# Patient Record
Sex: Female | Born: 1975 | Hispanic: Yes | Marital: Married | Smoking: Never smoker
Health system: Southern US, Community
[De-identification: ages and names within clinical notes are randomized; demographics above are authoritative.]

## PROBLEM LIST (undated history)

## (undated) ENCOUNTER — Inpatient Hospital Stay (HOSPITAL_COMMUNITY): Payer: Self-pay

## (undated) DIAGNOSIS — Z789 Other specified health status: Secondary | ICD-10-CM

## (undated) DIAGNOSIS — O09529 Supervision of elderly multigravida, unspecified trimester: Secondary | ICD-10-CM

## (undated) DIAGNOSIS — R87629 Unspecified abnormal cytological findings in specimens from vagina: Secondary | ICD-10-CM

## (undated) DIAGNOSIS — Z8759 Personal history of other complications of pregnancy, childbirth and the puerperium: Secondary | ICD-10-CM

## (undated) HISTORY — DX: Supervision of elderly multigravida, unspecified trimester: O09.529

## (undated) HISTORY — DX: Personal history of other complications of pregnancy, childbirth and the puerperium: Z87.59

## (undated) HISTORY — PX: NO PAST SURGERIES: SHX2092

## (undated) HISTORY — DX: Unspecified abnormal cytological findings in specimens from vagina: R87.629

---

## 2001-01-15 ENCOUNTER — Ambulatory Visit (HOSPITAL_COMMUNITY): Admission: RE | Admit: 2001-01-15 | Discharge: 2001-01-15 | Payer: Self-pay | Admitting: *Deleted

## 2001-01-16 ENCOUNTER — Inpatient Hospital Stay (HOSPITAL_COMMUNITY): Admission: AD | Admit: 2001-01-16 | Discharge: 2001-01-16 | Payer: Self-pay | Admitting: *Deleted

## 2001-01-22 ENCOUNTER — Encounter (HOSPITAL_COMMUNITY): Admission: RE | Admit: 2001-01-22 | Discharge: 2001-02-16 | Payer: Self-pay | Admitting: Obstetrics

## 2001-02-16 ENCOUNTER — Inpatient Hospital Stay (HOSPITAL_COMMUNITY): Admission: AD | Admit: 2001-02-16 | Discharge: 2001-02-18 | Payer: Self-pay | Admitting: *Deleted

## 2002-08-28 ENCOUNTER — Inpatient Hospital Stay (HOSPITAL_COMMUNITY): Admission: AD | Admit: 2002-08-28 | Discharge: 2002-08-28 | Payer: Self-pay | Admitting: Obstetrics and Gynecology

## 2002-08-30 ENCOUNTER — Inpatient Hospital Stay (HOSPITAL_COMMUNITY): Admission: AD | Admit: 2002-08-30 | Discharge: 2002-08-30 | Payer: Self-pay | Admitting: Obstetrics and Gynecology

## 2002-09-06 ENCOUNTER — Inpatient Hospital Stay (HOSPITAL_COMMUNITY): Admission: AD | Admit: 2002-09-06 | Discharge: 2002-09-06 | Payer: Self-pay | Admitting: Obstetrics and Gynecology

## 2002-09-16 ENCOUNTER — Inpatient Hospital Stay (HOSPITAL_COMMUNITY): Admission: AD | Admit: 2002-09-16 | Discharge: 2002-09-16 | Payer: Self-pay | Admitting: Obstetrics and Gynecology

## 2002-09-23 ENCOUNTER — Inpatient Hospital Stay (HOSPITAL_COMMUNITY): Admission: AD | Admit: 2002-09-23 | Discharge: 2002-09-23 | Payer: Self-pay | Admitting: Obstetrics and Gynecology

## 2002-11-27 ENCOUNTER — Encounter: Payer: Self-pay | Admitting: Obstetrics and Gynecology

## 2002-11-27 ENCOUNTER — Inpatient Hospital Stay (HOSPITAL_COMMUNITY): Admission: AD | Admit: 2002-11-27 | Discharge: 2002-11-27 | Payer: Self-pay | Admitting: Obstetrics and Gynecology

## 2005-02-07 ENCOUNTER — Ambulatory Visit: Payer: Self-pay | Admitting: Internal Medicine

## 2005-02-11 ENCOUNTER — Encounter: Admission: RE | Admit: 2005-02-11 | Discharge: 2005-02-11 | Payer: Self-pay | Admitting: Internal Medicine

## 2006-06-08 ENCOUNTER — Ambulatory Visit: Payer: Self-pay | Admitting: Internal Medicine

## 2006-06-08 ENCOUNTER — Encounter (INDEPENDENT_AMBULATORY_CARE_PROVIDER_SITE_OTHER): Payer: Self-pay | Admitting: Internal Medicine

## 2006-06-08 DIAGNOSIS — E049 Nontoxic goiter, unspecified: Secondary | ICD-10-CM | POA: Insufficient documentation

## 2006-06-08 HISTORY — DX: Nontoxic goiter, unspecified: E04.9

## 2007-03-18 ENCOUNTER — Inpatient Hospital Stay (HOSPITAL_COMMUNITY): Admission: AD | Admit: 2007-03-18 | Discharge: 2007-03-20 | Payer: Self-pay | Admitting: Obstetrics

## 2009-10-29 ENCOUNTER — Ambulatory Visit: Payer: Self-pay | Admitting: Internal Medicine

## 2009-10-29 DIAGNOSIS — N6019 Diffuse cystic mastopathy of unspecified breast: Secondary | ICD-10-CM | POA: Insufficient documentation

## 2009-11-20 ENCOUNTER — Ambulatory Visit: Payer: Self-pay | Admitting: Internal Medicine

## 2009-11-20 LAB — CONVERTED CEMR LAB
Bilirubin Urine: NEGATIVE
Blood in Urine, dipstick: NEGATIVE
Glucose, Urine, Semiquant: NEGATIVE
Ketones, urine, test strip: NEGATIVE
Nitrite: NEGATIVE
Rapid HIV Screen: NEGATIVE
Urobilinogen, UA: 0.2
WBC Urine, dipstick: NEGATIVE

## 2009-11-22 LAB — CONVERTED CEMR LAB
ALT: 11 units/L (ref 0–35)
Albumin: 4.4 g/dL (ref 3.5–5.2)
BUN: 13 mg/dL (ref 6–23)
Basophils Absolute: 0 10*3/uL (ref 0.0–0.1)
Basophils Relative: 0 % (ref 0–1)
CO2: 25 meq/L (ref 19–32)
Creatinine, Ser: 0.53 mg/dL (ref 0.40–1.20)
Glucose, Bld: 84 mg/dL (ref 70–99)
HCT: 42 % (ref 36.0–46.0)
Hemoglobin: 12.9 g/dL (ref 12.0–15.0)
Lymphocytes Relative: 27 % (ref 12–46)
Neutro Abs: 3.9 10*3/uL (ref 1.7–7.7)
Sodium: 139 meq/L (ref 135–145)
Total Bilirubin: 0.4 mg/dL (ref 0.3–1.2)
VLDL: 14 mg/dL (ref 0–40)

## 2009-11-24 ENCOUNTER — Ambulatory Visit: Payer: Self-pay | Admitting: Internal Medicine

## 2009-11-24 LAB — CONVERTED CEMR LAB
Glucose, Urine, Semiquant: NEGATIVE
Protein, U semiquant: NEGATIVE
WBC Urine, dipstick: NEGATIVE

## 2009-11-26 ENCOUNTER — Ambulatory Visit (HOSPITAL_COMMUNITY): Admission: RE | Admit: 2009-11-26 | Discharge: 2009-11-26 | Payer: Self-pay | Admitting: Internal Medicine

## 2009-11-27 ENCOUNTER — Encounter: Admission: RE | Admit: 2009-11-27 | Discharge: 2009-11-27 | Payer: Self-pay | Admitting: Internal Medicine

## 2009-11-30 ENCOUNTER — Encounter (INDEPENDENT_AMBULATORY_CARE_PROVIDER_SITE_OTHER): Payer: Self-pay | Admitting: Internal Medicine

## 2010-09-19 ENCOUNTER — Encounter: Payer: Self-pay | Admitting: Internal Medicine

## 2010-09-28 NOTE — Progress Notes (Signed)
Summary: Office Visit//DEPRESSION SCREENING  Office Visit//DEPRESSION SCREENING   Imported By: Arta Bruce 01/28/2010 14:40:12  _____________________________________________________________________  External Attachment:    Type:   Image     Comment:   External Document

## 2010-09-28 NOTE — Assessment & Plan Note (Signed)
Summary: cpp exam//gk   Vital Signs:  Patient profile:   35 year old female LMP:     10/20/2009 Weight:      106.44 pounds BMI:     21.95 Temp:     98.1 degrees F Pulse rate:   75 / minute Pulse rhythm:   regular Resp:     20 per minute BP sitting:   100 / 67  (left arm) Cuff size:   regular  Vitals Entered By: Chauncy Passy, SMA CC: Pt. is here for her 35 y/o CPP. Pt. is concerned of her left breast. She feels pain once in awhile.  Is Patient Diabetic? No Pain Assessment Patient in pain? no       Does patient need assistance? Functional Status Self care Ambulation Normal LMP (date): 10/20/2009     Enter LMP: 10/20/2009   CC:  Pt. is here for her 35 y/o CPP. Pt. is concerned of her left breast. She feels pain once in awhile. Marland Kitchen  History of Present Illness: 35 yo female here for CPP.  Concerns:  1.  Left breast discomfort:  Has had for many years--comes and goes.  Will have for a few days and then goes away.  Can have the discomfort even when not getting ready to start a period, but generally worse if surrounding a period.  Pt's periods are not generally regular.  Describes the pain as a burning sensation--can radiate around chest to scapular area.  Worsened pain with movement.  Takes Tylenol and pain resolves.  Had a breast ultrasound 4 years ago and pt. was told had fibrocystic breast disease.  2.  Discussed labs all okay.  Habits & Providers  Alcohol-Tobacco-Diet     Alcohol drinks/day: 0     Tobacco Status: never  Exercise-Depression-Behavior     Drug Use: never  Current Medications (verified): 1)  None  Allergies (verified): No Known Drug Allergies  Past History:  Past Surgical History: Last updated: 10/29/2009 None  Past Medical History: Reviewed history from 10/29/2009 and no changes required. THYROMEGALY (ICD-240.9) FIBROCYSTIC BREAST DISEASE (ICD-610.1)  Family History: Reviewed history from 10/29/2009 and no changes required. Mother,  9:  Healthy Father, 50:  Healthy 7 Siblings:  all healthy Son, 8:  healthy Son, 2:  healthy  Social History: Reviewed history from 10/29/2009 and no changes required. Originally from [place]. Has lived in [place]. for 9 years Married. Lives at home with husband and 2 sons Homemaker  Review of Systems General:  Energy:  good.. Eyes:  Denies blurring. ENT:  Denies decreased hearing. CV:  Denies chest pain or discomfort and palpitations. Resp:  Denies cough and shortness of breath. GI:  Denies abdominal pain, bloody stools, constipation, dark tarry stools, and diarrhea. GU:  Denies discharge, dysuria, and urinary frequency; LMP:  10/20/09. MS:  Denies joint pain, joint redness, and joint swelling. Derm:  Denies lesion(s) and rash; Bump in vaginal area--still there--but going away.. Neuro:  Denies numbness, tingling, and weakness. Psych:  Denies anxiety, depression, and suicidal thoughts/plans; PHQ-9 was 0.  Physical Exam  General:  Well-developed,well-nourished,in no acute distress; alert,appropriate and cooperative throughout examination Head:  Normocephalic and atraumatic without obvious abnormalities. No apparent alopecia or balding. Eyes:  No corneal or conjunctival inflammation noted. EOMI. Perrla. Funduscopic exam benign, without hemorrhages, exudates or papilledema. Vision grossly normal. Ears:  External ear exam shows no significant lesions or deformities.  Otoscopic examination reveals clear canals, tympanic membranes are intact bilaterally without bulging, retraction, inflammation or discharge. Hearing  is grossly normal bilaterally. Nose:  External nasal examination shows no deformity or inflammation. Nasal mucosa are pink and moist without lesions or exudates. Mouth:  Oral mucosa and oropharynx without lesions or exudates.  Teeth in good repair. Neck:  No deformities, masses, or tenderness noted. Thyromegaly noted again--no definite nodule Chest Wall:  No deformities, masses,  or tenderness noted. Breasts:  No mass, nodules, thickening, tenderness, bulging, retraction, inflamation, nipple discharge or skin changes noted of right breast.  Pt. with nodular thickening involving lateral superior left breast and extending across to medial quadrant.  Tender in this area.  No other focal mass, skin dimpling or axillary adenopathy  Lungs:  Normal respiratory effort, chest expands symmetrically. Lungs are clear to auscultation, no crackles or wheezes. Heart:  Normal rate and regular rhythm. S1 and S2 normal without gallop, murmur, click, rub or other extra sounds. Abdomen:  Bowel sounds positive,abdomen soft and non-tender without masses, organomegaly or hernias noted. Genitalia:  Pelvic Exam:        External: normal female genitalia without lesions or masses--has a healing papule inside her labia on the left--less than 1/2 cm with minimal erythema.  No discharge        Vagina: normal without lesions or masses        Cervix: normal without lesions or masses        Adnexa: normal bimanual exam without masses or fullness        Uterus: normal by palpation        Pap smear: performed Msk:  No deformity or scoliosis noted of thoracic or lumbar spine.   Pulses:  R and L carotid,radial,femoral,dorsalis pedis and posterior tibial pulses are full and equal bilaterally Extremities:  No clubbing, cyanosis, edema, or deformity noted with normal full range of motion of all joints.   Neurologic:  No cranial nerve deficits noted. Station and gait are normal. Plantar reflexes are down-going bilaterally. DTRs are symmetrical throughout. Sensory, motor and coordinative functions appear intact. Skin:  Intact without suspicious lesions or rashes Cervical Nodes:  No lymphadenopathy noted Axillary Nodes:  No palpable lymphadenopathy Inguinal Nodes:  No significant adenopathy Psych:  Cognition and judgment appear intact. Alert and cooperative with normal attention span and concentration. No apparent  delusions, illusions, hallucinations.  PHQ -9 was 0   Impression & Recommendations:  Problem # 1:  ROUTINE GYNECOLOGICAL EXAMINATION (ICD-V72.31) Start Yasmin Orders: UA Dipstick w/o Micro (manual) ([address]) KOH/ WET [place] [phone]) Pap Smear, Thin Prep ( Collection of) [phone]) T- GC Chlamydia ([address]) T-Pap Smear, Thin Prep ([address])  Problem # 2:  BREAST THICKENING, LEFT (ICD-611.72) Suspect this is fibrocystic breast disease. Orders: Mammogram (Diagnostic) (Mammo)  Problem # 3:  THYROMEGALY (ICD-240.9) TSH recently normal Orders: Ultrasound (Ultrasound)  Complete Medication List: 1)  Yasmin 28 3-0.03 Mg Tabs (Drospirenone-ethinyl estradiol) .Marland Kitchen.. 1 tab by mouth daily as directed.  Patient Instructions: 1)  Follow up with Dr. Delrae Alfred in 1-2 months to follow up on breast and thyroid concerns 2)  CPP in 1 year with Dr. Smitty Pluck for appt. Prescriptions: YASMIN 28 3-0.03 MG TABS (DROSPIRENONE-ETHINYL ESTRADIOL) 1 tab by mouth daily as directed.  #1 pack x 11   Entered and Authorized by:   Julieanne Manson MD   Signed by:   Julieanne Manson MD on 11/24/2009   Method used:   Printed then faxed to ...       [facility] (retail)       [address]  [place], [place]  [address]       Ph: [phone]       Fax: [phone]   RxID:   [id number]   Laboratory Results   Urine Tests    Routine Urinalysis   Color: yellow Appearance: Clear Glucose: negative   (Normal Range: Negative) Bilirubin: small   (Normal Range: Negative) Ketone: negative   (Normal Range: Negative) Spec. Gravity: >=1.030   (Normal Range: 1.003-1.035) Blood: negative   (Normal Range: Negative) pH: 5.5   (Normal Range: 5.0-8.0) Protein: negative   (Normal Range: Negative) Urobilinogen: 0.2   (Normal Range: 0-1) Nitrite: negative   (Normal Range: Negative) Leukocyte Esterace: negative   (Normal Range: Negative)        Preventive Care  Screening  Prior Values:    Last Tetanus Booster:  Td (11/06/2005)     SBE:  yes--no changes--see HPI Mammogram/ultrasound--states had 4 years ago in [place] either a fibroadenoma or fibrocystic breast disease.

## 2010-09-28 NOTE — Assessment & Plan Note (Signed)
Summary: office visit//gk   Vital Signs:  Patient profile:   35 year old female Height:      58.5 inches Weight:      105.4 pounds BMI:     21.73 Temp:     97.7 degrees F oral Pulse rate:   67 / minute Pulse rhythm:   regular Resp:     16 per minute BP sitting:   96 / 64  (left arm) Cuff size:   regular  Vitals Entered By: Geanie Cooley  (October 29, 2009 12:45 PM)  O2 Flow:   ` CC: Reestablish PCP, and wants to schedule complete physical. Is Patient Diabetic? No Pain Assessment Patient in pain? no       Does patient need assistance? Functional Status Self care Ambulation Normal   CC:  Reestablish PCP and and wants to schedule complete physical..  History of Present Illness: 35 yo female here to reestablish--last seen 05/2006 for CPP.  No health concerns today.  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 Has lived in [place]. for 9 years Married. Lives at home with husband and 2 sons HomemakerSmoking Status:  never Drug Use:  never  Physical Exam  Neck:  Mild thryoid enlargement--no nodules, however, and nontender. Lungs:  Normal respiratory effort, chest expands symmetrically. Lungs are clear to auscultation, no crackles or wheezes. Heart:  Normal rate and regular rhythm. S1 and S2 normal without gallop, murmur, click, rub or other extra sounds.   Impression & Recommendations:  Problem # 1:  THYROMEGALY (ICD-240.9) No change. Will Check TSH with fasting labs just prior to CPP  Patient Instructions: 1)  CPP  with Dr. Delrae Alfred next available--please schedule fasting labs 2 days prior to CPP:  FLP, CBC, CMET, TSH, UA, HIV, RPR   Tetanus/Td Immunization History:    Tetanus/Td # 1:  Td (11/06/2005)

## 2010-09-28 NOTE — Letter (Signed)
Summary: *HSN Results Follow up  [facility]  [address] [place], [place] [address]   Phone: [phone]  Fax: [phone]      11/30/2009   [facility] [address] [place], [place]  [address]   Dear  [name],                            ____S.Drinkard,FNP   ____D. Gore,FNP       ____B. McPherson,MD   ____V. Rankins,MD    __X__[name],MD    ____N. Daphine Deutscher, FNP  ____D. Reche Dixon, MD    ____K. Philipp Deputy, MD    ____Other     This letter is to inform you that your recent test(s):  ___X____Pap Smear    ____X___Lab Test     ____X___X-ray    ___X____ is within acceptable limits  _______ requires a medication change  _______ requires a follow-up lab visit  _______ requires a follow-up visit with your provider   Comments:  Pap smear, labs, mammogram and breast ultrasound were all okay--let me know if any changes in your left breast--feel this is fibrocystic breast disease.       _________________________________________________________ If you have any questions, please contact our office                     Sincerely,  Julieanne Manson MD [facility]

## 2011-01-11 NOTE — Op Note (Signed)
NAMEMarland Kitchen  Morgan Singleton, Morgan Singleton     ACCOUNT NO.:  [id number]   MEDICAL RECORD NO.:  [id number]          PATIENT TYPE:  INP   LOCATION:  9166                          FACILITY:  [facility]   PHYSICIAN:  Kathreen Cosier, M.D.DATE OF BIRTH:  March 24, 1976   DATE OF PROCEDURE:  03/18/2007  DATE OF DISCHARGE:                               OPERATIVE REPORT   DELIVERY NOTE   The patient is a 35 year old gravida 3, para 1-0-1-1, who is at term and  fully dilated and +2 station.  Had fetal decelerations down to 60 for  greater than three minutes.  Midline episiotomy was performed and a  vacuum applied for one contraction, no pop-off.  Delivery effected.  She  had a nuchal cord x1 which was cut prior to delivery of the shoulders.  Apgars were 6 and 8.  Placenta spontaneously, to L&D.  Episiotomy  repaired with 2-0 Vicryl.           ______________________________  Kathreen Cosier, M.D.     BAM/MEDQ  D:  03/18/2007  T:  03/19/2007  Job:  [id number]

## 2011-06-13 LAB — CBC
HCT: 35.9 — ABNORMAL LOW
Hemoglobin: 11.9 — ABNORMAL LOW
Hemoglobin: 13.7
Platelets: 161
RDW: 15.3 — ABNORMAL HIGH
RDW: 15.8 — ABNORMAL HIGH
WBC: 9.3

## 2011-06-13 LAB — RPR: RPR Ser Ql: NONREACTIVE

## 2012-04-26 ENCOUNTER — Ambulatory Visit: Payer: Self-pay | Admitting: Family Medicine

## 2012-04-26 ENCOUNTER — Ambulatory Visit: Admit: 2012-04-26 | Payer: Self-pay | Admitting: Obstetrics and Gynecology

## 2012-04-26 ENCOUNTER — Encounter (HOSPITAL_COMMUNITY): Payer: Self-pay

## 2012-04-26 ENCOUNTER — Inpatient Hospital Stay (HOSPITAL_COMMUNITY): Payer: Medicaid Other

## 2012-04-26 ENCOUNTER — Inpatient Hospital Stay (HOSPITAL_COMMUNITY): Payer: Medicaid Other | Admitting: Anesthesiology

## 2012-04-26 ENCOUNTER — Encounter (HOSPITAL_COMMUNITY): Payer: Self-pay | Admitting: Anesthesiology

## 2012-04-26 ENCOUNTER — Encounter (HOSPITAL_COMMUNITY)
Admission: AD | Disposition: A | Discharge status: Home / Self Care | Payer: Self-pay | Source: Ambulatory Visit | Attending: Obstetrics and Gynecology

## 2012-04-26 ENCOUNTER — Inpatient Hospital Stay (HOSPITAL_COMMUNITY)
Admission: AD | Admit: 2012-04-26 | Discharge: 2012-04-26 | Disposition: A | Discharge status: Home / Self Care | Payer: Medicaid Other | Source: Ambulatory Visit | Attending: Obstetrics and Gynecology | Admitting: Obstetrics and Gynecology

## 2012-04-26 VITALS — BP 104/60 | HR 84 | Temp 98.4°F | Resp 16 | Ht 59.5 in | Wt 107.0 lb

## 2012-04-26 DIAGNOSIS — R109 Unspecified abdominal pain: Secondary | ICD-10-CM | POA: Insufficient documentation

## 2012-04-26 DIAGNOSIS — N898 Other specified noninflammatory disorders of vagina: Secondary | ICD-10-CM

## 2012-04-26 DIAGNOSIS — Z789 Other specified health status: Secondary | ICD-10-CM

## 2012-04-26 DIAGNOSIS — R1084 Generalized abdominal pain: Secondary | ICD-10-CM

## 2012-04-26 DIAGNOSIS — O00109 Unspecified tubal pregnancy without intrauterine pregnancy: Secondary | ICD-10-CM

## 2012-04-26 DIAGNOSIS — Z609 Problem related to social environment, unspecified: Secondary | ICD-10-CM

## 2012-04-26 DIAGNOSIS — Z331 Pregnant state, incidental: Secondary | ICD-10-CM

## 2012-04-26 DIAGNOSIS — N939 Abnormal uterine and vaginal bleeding, unspecified: Secondary | ICD-10-CM

## 2012-04-26 DIAGNOSIS — Z3490 Encounter for supervision of normal pregnancy, unspecified, unspecified trimester: Secondary | ICD-10-CM

## 2012-04-26 HISTORY — DX: Other specified health status: Z78.9

## 2012-04-26 HISTORY — PX: LAPAROSCOPY: SHX197

## 2012-04-26 LAB — POCT URINALYSIS DIPSTICK
Bilirubin, UA: NEGATIVE
Blood, UA: NEGATIVE
Glucose, UA: NEGATIVE
Ketones, UA: NEGATIVE
Urobilinogen, UA: 0.2
pH, UA: 6

## 2012-04-26 LAB — POCT CBC
Lymph, poc: 1.5 (ref 0.6–3.4)
MCHC: 29.8 g/dL — AB (ref 31.8–35.4)
MCV: 84.3 fL (ref 80–97)
POC LYMPH PERCENT: 28.3 %L (ref 10–50)
Platelet Count, POC: 275 10*3/uL (ref 142–424)
RBC: 3.9 M/uL — AB (ref 4.04–5.48)
WBC: 5.4 10*3/uL (ref 4.6–10.2)

## 2012-04-26 LAB — WET PREP, GENITAL
Clue Cells Wet Prep HPF POC: NONE SEEN
Trich, Wet Prep: NONE SEEN

## 2012-04-26 LAB — ABO/RH: ABO/RH(D): O POS

## 2012-04-26 LAB — HCG, QUANTITATIVE, PREGNANCY: hCG, Beta Chain, Quant, S: 842 m[IU]/mL — ABNORMAL HIGH (ref <5)

## 2012-04-26 LAB — CBC
MCH: 26.6 pg (ref 26.0–34.0)
MCV: 81.3 fL (ref 78.0–100.0)
RBC: 3.53 MIL/uL — ABNORMAL LOW (ref 3.87–5.11)
WBC: 5.5 10*3/uL (ref 4.0–10.5)

## 2012-04-26 LAB — TYPE AND SCREEN

## 2012-04-26 SURGERY — LAPAROSCOPY OPERATIVE
Anesthesia: General | Site: Abdomen | Wound class: Clean Contaminated

## 2012-04-26 MED ORDER — FENTANYL CITRATE 0.05 MG/ML IJ SOLN
INTRAMUSCULAR | Status: DC | PRN
  Administered 2012-04-26 (×3): 100 ug via INTRAVENOUS

## 2012-04-26 MED ORDER — GLYCOPYRROLATE 0.2 MG/ML IJ SOLN
INTRAMUSCULAR | Status: DC | PRN
  Administered 2012-04-26: 0.4 mg via INTRAVENOUS

## 2012-04-26 MED ORDER — FENTANYL CITRATE 0.05 MG/ML IJ SOLN
INTRAMUSCULAR | Status: AC
  Filled 2012-04-26: qty 2

## 2012-04-26 MED ORDER — OXYCODONE-ACETAMINOPHEN 5-325 MG PO TABS: 1.0000 | ORAL_TABLET | ORAL | Status: AC | PRN

## 2012-04-26 MED ORDER — BUPIVACAINE HCL (PF) 0.25 % IJ SOLN
INTRAMUSCULAR | Status: DC | PRN
  Administered 2012-04-26 (×2): 5 mL

## 2012-04-26 MED ORDER — PROPOFOL 10 MG/ML IV EMUL
INTRAVENOUS | Status: DC | PRN
  Administered 2012-04-26: 100 mg via INTRAVENOUS

## 2012-04-26 MED ORDER — ROCURONIUM BROMIDE 100 MG/10ML IV SOLN
INTRAVENOUS | Status: DC | PRN
  Administered 2012-04-26: 5 mg via INTRAVENOUS
  Administered 2012-04-26: 20 mg via INTRAVENOUS

## 2012-04-26 MED ORDER — DEXAMETHASONE SODIUM PHOSPHATE 4 MG/ML IJ SOLN
INTRAMUSCULAR | Status: DC | PRN
  Administered 2012-04-26: 10 mg via INTRAVENOUS

## 2012-04-26 MED ORDER — EPHEDRINE SULFATE 50 MG/ML IJ SOLN
INTRAMUSCULAR | Status: DC | PRN
  Administered 2012-04-26 (×2): 10 mg via INTRAVENOUS

## 2012-04-26 MED ORDER — METOCLOPRAMIDE HCL 5 MG/ML IJ SOLN
10.0000 mg | Freq: Once | INTRAMUSCULAR | Status: AC
  Administered 2012-04-26: 10 mg via INTRAVENOUS

## 2012-04-26 MED ORDER — MIDAZOLAM HCL 5 MG/5ML IJ SOLN
INTRAMUSCULAR | Status: DC | PRN
  Administered 2012-04-26: 2 mg via INTRAVENOUS

## 2012-04-26 MED ORDER — SUCCINYLCHOLINE CHLORIDE 20 MG/ML IJ SOLN
INTRAMUSCULAR | Status: DC | PRN
  Administered 2012-04-26: 100 mg via INTRAVENOUS

## 2012-04-26 MED ORDER — FAMOTIDINE IN NACL 20-0.9 MG/50ML-% IV SOLN
INTRAVENOUS | Status: AC
  Administered 2012-04-26: 20 mg via INTRAVENOUS
  Filled 2012-04-26: qty 50

## 2012-04-26 MED ORDER — NEOSTIGMINE METHYLSULFATE 1 MG/ML IJ SOLN
INTRAMUSCULAR | Status: DC | PRN
  Administered 2012-04-26: 3 mg via INTRAVENOUS

## 2012-04-26 MED ORDER — LACTATED RINGERS IV SOLN
INTRAVENOUS | Status: DC
  Administered 2012-04-26: 1000 mL via INTRAVENOUS
  Administered 2012-04-26: 16:00:00 via INTRAVENOUS

## 2012-04-26 MED ORDER — FENTANYL CITRATE 0.05 MG/ML IJ SOLN
25.0000 ug | INTRAMUSCULAR | Status: DC | PRN
  Administered 2012-04-26 (×3): 25 ug via INTRAVENOUS

## 2012-04-26 MED ORDER — IBUPROFEN 600 MG PO TABS: 600.0000 mg | ORAL_TABLET | Freq: Four times a day (QID) | ORAL | Status: AC | PRN

## 2012-04-26 MED ORDER — LIDOCAINE HCL (CARDIAC) 20 MG/ML IV SOLN
INTRAVENOUS | Status: DC | PRN
  Administered 2012-04-26: 40 mg via INTRAVENOUS

## 2012-04-26 MED ORDER — METOCLOPRAMIDE HCL 5 MG/ML IJ SOLN
INTRAMUSCULAR | Status: AC
  Filled 2012-04-26: qty 2

## 2012-04-26 MED ORDER — KETOROLAC TROMETHAMINE 30 MG/ML IJ SOLN
15.0000 mg | Freq: Once | INTRAMUSCULAR | Status: AC | PRN
  Administered 2012-04-26: 30 mg via INTRAVENOUS

## 2012-04-26 MED ORDER — DOCUSATE SODIUM 100 MG PO CAPS: 100.0000 mg | ORAL_CAPSULE | Freq: Two times a day (BID) | ORAL | Status: AC | PRN

## 2012-04-26 MED ORDER — KETOROLAC TROMETHAMINE 30 MG/ML IJ SOLN
INTRAMUSCULAR | Status: AC
  Filled 2012-04-26: qty 1

## 2012-04-26 MED ORDER — FAMOTIDINE IN NACL 20-0.9 MG/50ML-% IV SOLN
20.0000 mg | Freq: Once | INTRAVENOUS | Status: AC
  Administered 2012-04-26: 20 mg via INTRAVENOUS

## 2012-04-26 MED ORDER — ONDANSETRON HCL 4 MG/2ML IJ SOLN
INTRAMUSCULAR | Status: DC | PRN
  Administered 2012-04-26: 4 mg via INTRAVENOUS

## 2012-04-26 SURGICAL SUPPLY — 24 items
CHLORAPREP W/TINT 26ML (MISCELLANEOUS) ×2 IMPLANT
DERMABOND ADVANCED (GAUZE/BANDAGES/DRESSINGS) ×1
DERMABOND ADVANCED .7 DNX12 (GAUZE/BANDAGES/DRESSINGS) ×1 IMPLANT
ELECT REM PT RETURN 9FT ADLT (ELECTROSURGICAL)
ELECTRODE REM PT RTRN 9FT ADLT (ELECTROSURGICAL) IMPLANT
GLOVE BIOGEL PI IND STRL 6.5 (GLOVE) ×2 IMPLANT
GLOVE BIOGEL PI INDICATOR 6.5 (GLOVE) ×2
GLOVE SURG SS PI 6.0 STRL IVOR (GLOVE) ×2 IMPLANT
GOWN PREVENTION PLUS LG XLONG (DISPOSABLE) ×4 IMPLANT
HEMOSTAT SURGICEL 2X14 (HEMOSTASIS) ×2 IMPLANT
NS IRRIG 1000ML POUR BTL (IV SOLUTION) ×2 IMPLANT
PACK LAPAROSCOPY BASIN (CUSTOM PROCEDURE TRAY) ×2 IMPLANT
POUCH SPECIMEN RETRIEVAL 10MM (ENDOMECHANICALS) ×4 IMPLANT
PROTECTOR NERVE ULNAR (MISCELLANEOUS) ×2 IMPLANT
SEALER TISSUE G2 CVD JAW 35 (ENDOMECHANICALS) ×1 IMPLANT
SEALER TISSUE G2 CVD JAW 45CM (ENDOMECHANICALS) ×1
SET IRRIG TUBING LAPAROSCOPIC (IRRIGATION / IRRIGATOR) ×2 IMPLANT
SUT MNCRL AB 4-0 PS2 18 (SUTURE) ×2 IMPLANT
SUT VICRYL 0 UR6 27IN ABS (SUTURE) ×4 IMPLANT
TOWEL OR 17X24 6PK STRL BLUE (TOWEL DISPOSABLE) ×4 IMPLANT
TROCAR BALLN 12MMX100 BLUNT (TROCAR) ×2 IMPLANT
TROCAR XCEL NON-BLD 11X100MML (ENDOMECHANICALS) IMPLANT
TROCAR XCEL NON-BLD 5MMX100MML (ENDOMECHANICALS) ×4 IMPLANT
WATER STERILE IRR 1000ML POUR (IV SOLUTION) IMPLANT

## 2012-04-26 NOTE — Anesthesia Procedure Notes (Signed)
Procedure Name: Intubation Performed by: Marrion Coy R Pre-anesthesia Checklist: Patient identified, Emergency Drugs available, Suction available, Patient being monitored and Timeout performed Patient Re-evaluated:Patient Re-evaluated prior to inductionOxygen Delivery Method: Circle system utilized Preoxygenation: Pre-oxygenation with 100% oxygen Intubation Type: IV induction and Cricoid Pressure applied Ventilation: Mask ventilation without difficulty Laryngoscope Size: Mac and 3 Grade View: Grade II Tube type: Oral Tube size: 7.0 mm Number of attempts: 1 Placement Confirmation: ETT inserted through vocal cords under direct vision,  positive ETCO2 and breath sounds checked- equal and bilateral Tube secured with: Tape Dental Injury: Teeth and Oropharynx as per pre-operative assessment

## 2012-04-26 NOTE — MAU Note (Signed)
Patient states she has had abdominal pain for 6-7 days, had spotting this am but after being examined at Urgent Care states the bleeding is heavier.

## 2012-04-26 NOTE — Anesthesia Preprocedure Evaluation (Signed)
Anesthesia Evaluation  Patient identified by MRN, date of birth, ID band Patient awake    Reviewed: Allergy & Precautions, H&P , NPO status , Patient's Chart, lab work & pertinent test results, reviewed documented beta blocker date and time   History of Anesthesia Complications Negative for: history of anesthetic complications  Airway Mallampati: II TM Distance: >3 FB Neck ROM: full    Dental  (+) Teeth Intact   Pulmonary neg pulmonary ROS,  breath sounds clear to auscultation  Pulmonary exam normal       Cardiovascular negative cardio ROS  Rhythm:regular Rate:Normal     Neuro/Psych negative neurological ROS  negative psych ROS   GI/Hepatic negative GI ROS, Neg liver ROS,   Endo/Other  negative endocrine ROS  Renal/GU negative Renal ROS     Musculoskeletal   Abdominal   Peds  Hematology  (+) anemia ,   Anesthesia Other Findings   Reproductive/Obstetrics (+) Pregnancy (ectopic pregnancy)                           Anesthesia Physical Anesthesia Plan  ASA: II and Emergent  Anesthesia Plan: General ETT and Modified Rapid Sequence   Post-op Pain Management:    Induction:   Airway Management Planned:   Additional Equipment:   Intra-op Plan:   Post-operative Plan:   Informed Consent: I have reviewed the patients History and Physical, chart, labs and discussed the procedure including the risks, benefits and alternatives for the proposed anesthesia with the patient or authorized representative who has indicated his/her understanding and acceptance.   Dental Advisory Given  Plan Discussed with: CRNA and Surgeon  Anesthesia Plan Comments:         Anesthesia Quick Evaluation

## 2012-04-26 NOTE — Anesthesia Postprocedure Evaluation (Signed)
Anesthesia Post Note  Patient: Morgan Singleton  Procedure(s) Performed: Procedure(s) (LRB): LAPAROSCOPY OPERATIVE (N/A)  Anesthesia type: General  Patient location: PACU  Post pain: Pain level controlled  Post assessment: Post-op Vital signs reviewed  Last Vitals:  Filed Vitals:   04/26/12 1845  BP: 102/59  Pulse: 67  Temp:   Resp: 28    Post vital signs: Reviewed  Level of consciousness: sedated  Complications: No apparent anesthesia complications

## 2012-04-26 NOTE — Transfer of Care (Signed)
Immediate Anesthesia Transfer of Care Note  Patient: Morgan Singleton  Procedure(s) Performed: Procedure(s) (LRB): LAPAROSCOPY OPERATIVE (N/A)  Patient Location: PACU  Anesthesia Type: General  Level of Consciousness: awake, alert , oriented and patient cooperative  Airway & Oxygen Therapy: Patient Spontanous Breathing and Patient connected to nasal cannula oxygen  Post-op Assessment: Report given to PACU RN, Post -op Vital signs reviewed and stable and Patient moving all extremities X 4  Post vital signs: Reviewed and stable  Complications: No apparent anesthesia complications

## 2012-04-26 NOTE — Patient Instructions (Signed)
[facility]

## 2012-04-26 NOTE — Op Note (Signed)
Morgan Singleton PROCEDURE DATE: 04/26/2012  PREOPERATIVE DIAGNOSIS: Ruptured ectopic pregnancy POSTOPERATIVE DIAGNOSIS: Ruptured left fallopian tube ectopic pregnancy PROCEDURE: Laparoscopic left salpingectomy and removal of ectopic pregnancy SURGEON:  Dr. Catalina Antigua ASSISTANT: none ANESTHESIOLOGIST: Dana Allan, MD Orlie Pollen, CRNA - CRNA Dana Allan, MD - Anesthesiologist  INDICATIONS: 36 y.o. [phone] at [redacted]w[redacted]d here for with ruptured ectopic pregnancy. On exam, she had stable vital signs, and an acute abdomen. Hgb  Lab Results  Component Value Date   HGB 9.4* 04/26/2012   , blood type O POS. Patient was counseled regarding need for laparoscopic salpingectomy. Risks of surgery including bleeding which may require transfusion or reoperation, infection, injury to bowel or other surrounding organs, need for additional procedures including laparotomy and other postoperative/anesthesia complications were explained to patient.  Written informed consent was obtained.  FINDINGS:  300cc amount of hemoperitoneum of blood and clots.  Dilated left fallopian tube containing ectopic gestation with large adherent clot at fimbriated end. Small normal appearing uterus, normal right fallopian tube, left ovary and right ovary.  ANESTHESIA: General INTRAVENOUS FLUIDS: .2000 ml ESTIMATED BLOOD LOSS:  200 ml URINE OUTPUT: 100 ml SPECIMENS: left fallopian tube containing ectopic gestation and blood clot COMPLICATIONS: None immediate  PROCEDURE IN DETAIL:  The patient was taken to the operating room where general anesthesia was administered and was found to be adequate.  She was placed in the dorsal lithotomy position, and was prepped and draped in a sterile manner.  A Foley catheter was inserted into her bladder and attached to Adalay Azucena drainage and a uterine manipulator was then advanced into the uterus .  After an adequate timeout was performed, attention was then turned to the patient's abdomen  where a 10-mm skin incision was made on the umbilical fold.  The fascia was identified, tented up and incised with Mayo scissors. The fascia was tagged with 0- Vicryl. The peritoneum was identified tented up and entered sharply with Metzenbaum scissors.  10-mm trocar and sleeve were then advanced without difficulty into the abdomen where intraabdominal placement was confirmed by the laparoscope. Pneumoperitoneum was achieved with insufflation of CO2 gas. A survey of the patient's pelvis and abdomen revealed the findings as above.  Two ports were placed under direct visualization; 5-mm on the right 2 cm above and medial to the superior iliac spine and another 5-mm on the right 5 cm above the previous one.  The 5-mm Nezhat suction irrigator was then used to suction the hemoperitoneum and irrigate the pelvis.  Attention was then turned to the left fallopian tube which was grasped and ligated from the underlying mesosalpinx and uterine attachment using the Enseal instrument.  Good hemostasis was noted.  The specimen was placed in an EndoCatch bag and removed from the abdomen intact.  The abdomen was desufflated, and all instruments were removed.  The fascial incisions of both 10-mm sites were reapproximated with 0 Vicryl figure-of-eight stiches; and all skin incisions were closed with a 3-0 Vicryl subcuticular stitch. The patient tolerated the procedures well.  All instruments, needles, and sponge counts were correct x 2. The patient was taken to the recovery room in stable condition.    The patient will be discharged to home as per PACU criteria.  Routine postoperative instructions given.  She was prescribed Percocet, Ibuprofen and Colace.  She will follow up in the clinic in 2 weeks for postoperative evaluation .

## 2012-04-26 NOTE — MAU Provider Note (Signed)
History     CSN: [id number]  Arrival date and time: 04/26/12 1255   First Provider Initiated Contact with Patient 04/26/12 1344      Chief Complaint  Patient presents with  . Abdominal Pain  . Vaginal Bleeding   HPI  Morgan Singleton is 36 y.o. [id number], [redacted]w[redacted]d weeks presenting with abdominal pain unaware that she was pregnant.  LMP 04/04/12, normal cycle for her.  Not using anything for contraception. 1 sexual partner.  Pain began 1 week ago with bleeding that began this am. States it was in the pelvis earlier this week but now lower midline. Rating a 5/10.  Went to Urgent Care.  Sent here for further evaluation. Pain is worsened by movement.  Denies nausea and vomiting.    No past medical history on file.  No past surgical history on file.  No family history on file.  History  Substance Use Topics  . Smoking status: Never Smoker   . Smokeless tobacco: Not on file  . Alcohol Use: Not on file    Allergies: No Known Allergies  No prescriptions prior to admission    Review of Systems  Constitutional: Negative.   HENT: Negative.   Eyes: Negative.   Cardiovascular: Negative.   Gastrointestinal: Positive for abdominal pain (lower mid abdomen ). Negative for nausea and vomiting.  Genitourinary:       + vaginal bleeding   Physical Exam   Blood pressure 103/71, pulse 67, temperature 97.9 F (36.6 C), temperature source Oral, resp. rate 16, height 4\' 11"  (1.499 m), weight 48.716 kg (107 lb 6.4 oz), last menstrual period 04/04/2012, SpO2 100.00%.  Physical Exam  Constitutional: She is oriented to person, place, and time. She appears well-developed and well-nourished. No distress.  HENT:  Head: Normocephalic.  Neck: Normal range of motion.  Cardiovascular: Normal rate.   Respiratory: Effort normal.  GI: Soft. She exhibits no distension and no mass. There is tenderness (diffuse tenderness without point tenderness). There is no rebound and no guarding.  Genitourinary:  Uterus is tender (mild on palpation). Uterus is not enlarged. Right adnexum displays no mass, no tenderness and no fullness. Left adnexum displays no mass, no tenderness and no fullness. There is bleeding (moderate bright red blood without clot) around the vagina.  Neurological: She is alert and oriented to person, place, and time.  Skin: Skin is warm and dry.  Psychiatric: She has a normal mood and affect. Her behavior is normal.   Results for orders placed during the hospital encounter of 04/26/12 (from the past 24 hour(s))  HCG, QUANTITATIVE, PREGNANCY     Status: Abnormal   Collection Time   04/26/12  1:00 PM      Component Value Range   hCG, Beta Chain, Quant, S 842 (*) <5 mIU/mL  ABO/RH     Status: Normal   Collection Time   04/26/12  1:10 PM      Component Value Range   ABO/RH(D) O POS    WET PREP, GENITAL     Status: Abnormal   Collection Time   04/26/12  1:55 PM      Component Value Range   Yeast Wet Prep HPF POC NONE SEEN  NONE SEEN   Trich, Wet Prep NONE SEEN  NONE SEEN   Clue Cells Wet Prep HPF POC NONE SEEN  NONE SEEN   WBC, Wet Prep HPF POC FEW (*) NONE SEEN   POC CBC at Urgent Care--WBC 5.4  HGB 9.8                                             HCT 32.9  MAU Course  Procedures  GC/CHL culture to lab. MDM 14:43  Radiology called reported ruptured ectopic on left.  Call to Dr. Jolayne Panther to report findings.  She is on her way down. IV and type and screen ordered.    Assessment and Plan  A:  Ruptured left ectopic pregnancy  P:  To OR  KEY,EVE M 04/26/2012, 1:44 PM

## 2012-04-26 NOTE — Progress Notes (Signed)
Subjective:    Patient ID: Morgan Singleton, female    DOB: 1976/03/21, 36 y.o.   MRN: [id number]  HPI Morgan Singleton is a 36 y.o. female  Lower abdominal and pelvic pain - past 6 days.  No fever, N/V,  No urinary sx's.  No diarrhea, no constipation.  Worst pain yesterday - less today.   Tx: tylenol.    G3 P2 - abortion in 2003, 2 children - 3 yo and 34 yo.    Review of Systems  Constitutional: Negative for fever and chills.  Genitourinary: Positive for vaginal bleeding (small amount of bleeding today, LMP 04/04/12 - normal. did miss July menses, but normal menses in June. ). Negative for urgency, hematuria, vaginal discharge, difficulty urinating and vaginal pain.       Objective:   Physical Exam  Constitutional: She is oriented to person, place, and time. She appears well-developed and well-nourished.  HENT:  Head: Normocephalic and atraumatic.  Eyes: EOM are normal. Pupils are equal, round, and reactive to light.  Cardiovascular: Normal rate, regular rhythm, normal heart sounds and intact distal pulses.   Pulmonary/Chest: Effort normal and breath sounds normal.  Abdominal: Soft. Normal appearance and bowel sounds are normal. There is no hepatosplenomegaly. There is tenderness in the right upper quadrant, suprapubic area and left lower quadrant. There is tenderness at McBurney's point. There is no rigidity, no rebound, no guarding, no CVA tenderness and negative Murphy's sign.  Genitourinary: Right adnexum displays tenderness. Left adnexum displays tenderness. There is bleeding around the vagina. No tenderness around the vagina. No foreign body around the vagina.       Bleeding with some dark/clooted blot at cervical os, but no tissue visualized.   Neurological: She is alert and oriented to person, place, and time.  Skin: Skin is warm and dry.  Psychiatric: She has a normal mood and affect. Her behavior is normal.   Results for orders placed in visit on 04/26/12    POCT CBC      Component Value Range   WBC 5.4  4.6 - 10.2 K/uL   Lymph, poc 1.5  0.6 - 3.4   POC LYMPH PERCENT 28.3  10 - 50 %L   MID (cbc) 0.5  0 - 0.9   POC MID % 8.4  0 - 12 %M   POC Granulocyte 3.4  2 - 6.9   Granulocyte percent 63.3  37 - 80 %G   RBC 3.90 (*) 4.04 - 5.48 M/uL   Hemoglobin 9.8 (*) 12.2 - 16.2 g/dL   HCT, POC 40.9 (*) 81.1 - 47.9 %   MCV 84.3  80 - 97 fL   MCH, POC 25.1 (*) 27 - 31.2 pg   MCHC 29.8 (*) 31.8 - 35.4 g/dL   RDW, POC 91.4     Platelet Count, POC 275  142 - 424 K/uL   MPV 9.5  0 - 99.8 fL  POCT URINALYSIS DIPSTICK      Component Value Range   Color, UA yellow     Clarity, UA clear     Glucose, UA neg     Bilirubin, UA neg     Ketones, UA neg     Spec Grav, UA 1.025     Blood, UA neg     pH, UA 6.0     Protein, UA neg     Urobilinogen, UA 0.2     Nitrite, UA neg     Leukocytes, UA Negative    POCT URINE PREGNANCY  Component Value Range   Preg Test, Ur Positive           Assessment & Plan:  Morgan Singleton is a 36 y.o. female 1. Abdominal pain, acute, generalized  POCT CBC, POCT UA - Microscopic Only, POCT urinalysis dipstick  2. Vaginal bleeding, abnormal  POCT CBC, POCT urine pregnancy   Lower abdominal pain in newly diagnosed pregnancy with spotting this am - ddx impending/incomplete Ab, but with abdom pain/pelvic pain may need US imaging.  Discussed with nurse midwife at [facility].  Will be evaluated there - sent by private vehicle. Hemodynamically stable.   Language barrier - spanish spoken - understanding expressed.

## 2012-04-26 NOTE — H&P (Signed)
See MAU note.  Patient was seen in radiology suite. Results of the ultrasound were reviewed and explained. Need for surgical treatment explained to the patient. Risks, benefits and alternatives were reviewed including but not limited to risk of bleeding, infection and damage to adjacent organs. Patient verbalized understanding and all questions were answered. Patient signed consent for laparoscopic salpingectomy.

## 2012-04-27 ENCOUNTER — Encounter (HOSPITAL_COMMUNITY): Payer: Self-pay | Admitting: Obstetrics and Gynecology

## 2012-04-27 LAB — GC/CHLAMYDIA PROBE AMP, GENITAL: GC Probe Amp, Genital: NEGATIVE

## 2012-05-02 NOTE — MAU Provider Note (Signed)
Agree with above note.  Morgan Singleton 05/02/2012 5:22 AM

## 2012-05-14 ENCOUNTER — Ambulatory Visit (INDEPENDENT_AMBULATORY_CARE_PROVIDER_SITE_OTHER): Payer: Self-pay | Admitting: Obstetrics and Gynecology

## 2012-05-14 VITALS — BP 96/69 | HR 118 | Temp 97.3°F | Wt 107.3 lb

## 2012-05-14 DIAGNOSIS — Z09 Encounter for follow-up examination after completed treatment for conditions other than malignant neoplasm: Secondary | ICD-10-CM

## 2012-05-14 MED ORDER — FERROUS SULFATE 325 (65 FE) MG PO TABS: 325.0000 mg | ORAL_TABLET | Freq: Two times a day (BID) | ORAL | Status: DC

## 2012-05-14 MED ORDER — NORGESTIMATE-ETH ESTRADIOL 0.25-35 MG-MCG PO TABS: 1.0000 | ORAL_TABLET | Freq: Every day | ORAL | Status: DC

## 2012-05-14 NOTE — Progress Notes (Signed)
Patient ID: Morgan Singleton, female   DOB: [birth year], 36 y.o.   MRN: [id number] 36 yo [id number] s/p left salpingectomy on 8/29 for treatment of ectopic pregnancy presenting today for post-op check. Patient reports doing well since the surgery. Pathology results reviewed and explained to the patient. This was not a planned pregnancy and patient desires OCP for birth control. Patient has taken OCP in the past without any complications.   Abd: soft/NT/ND  Incisions: healed well.Singleton erythema, induration or drainage  A/P 36 yo s/p laparoscopic left salpingectomy for a ruptures ectopic pregnancy. - Patient medically cleared to resume all activities of daily living - Sprintec prescribed - Patient is an established patient at the health department and is planning to follow-up with them for further care or issues. - patient is not planning on becoming pregnant in the near future but understands her increased risk of another ectopic pregnancy as well as the importance of seeking early prenatal care for subsequent pregnancies.

## 2014-01-18 ENCOUNTER — Inpatient Hospital Stay (HOSPITAL_COMMUNITY): Payer: Medicaid Other

## 2014-01-18 ENCOUNTER — Encounter (HOSPITAL_COMMUNITY): Payer: Self-pay

## 2014-01-18 ENCOUNTER — Inpatient Hospital Stay (HOSPITAL_COMMUNITY)
Admission: AD | Admit: 2014-01-18 | Discharge: 2014-01-18 | Disposition: A | Discharge status: Home / Self Care | Payer: Medicaid Other | Source: Ambulatory Visit | Attending: Obstetrics & Gynecology | Admitting: Obstetrics & Gynecology

## 2014-01-18 DIAGNOSIS — O209 Hemorrhage in early pregnancy, unspecified: Secondary | ICD-10-CM | POA: Insufficient documentation

## 2014-01-18 DIAGNOSIS — R109 Unspecified abdominal pain: Secondary | ICD-10-CM | POA: Insufficient documentation

## 2014-01-18 LAB — URINALYSIS, ROUTINE W REFLEX MICROSCOPIC
BILIRUBIN URINE: NEGATIVE
Glucose, UA: NEGATIVE mg/dL
HGB URINE DIPSTICK: NEGATIVE
KETONES UR: NEGATIVE mg/dL
Leukocytes, UA: NEGATIVE
NITRITE: NEGATIVE
Protein, ur: NEGATIVE mg/dL
Specific Gravity, Urine: 1.025 (ref 1.005–1.030)
UROBILINOGEN UA: 0.2 mg/dL (ref 0.0–1.0)
pH: 6 (ref 5.0–8.0)

## 2014-01-18 LAB — WET PREP, GENITAL
CLUE CELLS WET PREP: NONE SEEN
Trich, Wet Prep: NONE SEEN

## 2014-01-18 LAB — CBC
HEMATOCRIT: 38.6 % (ref 36.0–46.0)
Hemoglobin: 12.9 g/dL (ref 12.0–15.0)
MCH: 27.3 pg (ref 26.0–34.0)
MCHC: 33.4 g/dL (ref 30.0–36.0)
MCV: 81.6 fL (ref 78.0–100.0)
PLATELETS: 226 10*3/uL (ref 150–400)
RBC: 4.73 MIL/uL (ref 3.87–5.11)
RDW: 14.9 % (ref 11.5–15.5)
WBC: 8.1 10*3/uL (ref 4.0–10.5)

## 2014-01-18 LAB — POCT PREGNANCY, URINE: Preg Test, Ur: POSITIVE — AB

## 2014-01-18 LAB — HCG, QUANTITATIVE, PREGNANCY: hCG, Beta Chain, Quant, S: 92367 m[IU]/mL — ABNORMAL HIGH (ref <5)

## 2014-01-18 MED ORDER — PRENATAL VITAMINS 0.8 MG PO TABS: 1.0000 | ORAL_TABLET | Freq: Every day | ORAL | Status: DC

## 2014-01-18 NOTE — MAU Provider Note (Signed)
Attestation of Attending Supervision of Obstetric Fellow: Evaluation and management procedures were performed by the Obstetric Fellow under my supervision and collaboration.  I have reviewed the Obstetric Fellow's note and chart, and I agree with the management and plan.  Lyfe Reihl, MD, FACOG Attending Obstetrician & Gynecologist Faculty Practice, [facility]   

## 2014-01-18 NOTE — MAU Note (Signed)
Pt states via Janett Billow interpreter that pain in middle of lower abdomen began three days ago. Intermittent. Bleeding a small amount this am. Yesterday had brown discharge.

## 2014-01-18 NOTE — Discharge Instructions (Signed)
Hemorragia vaginal durante el embarazo (primer trimestre)  (Vaginal Bleeding During Pregnancy, First Trimester)  Durante los primeros meses del embarazo es relativamente frecuente que se presente una pequeña hemorragia (manchas). Esta situación generalmente mejora por sí misma. Estas hemorragias o manchas tienen diversas causas al inicio del embarazo. Algunas manchas pueden estar relacionadas al embarazo y otras no. En la mayoría de los casos, la hemorragia es normal y no es un problema. Sin embargo, la hemorragia también puede ser un signo de algo grave. Debe informar a su médico de inmediato si tiene alguna hemorragia vaginal.  Algunas causas posibles de hemorragia vaginal durante el primer trimestre incluyen:  · Infección o inflamación del cuello del útero.  · Crecimientos anormales (pólipos) en el cuello del útero.  · Aborto espontáneo o amenaza de aborto espontáneo.  · Tejido del embarazo se ha desarrollado fuera del útero y en una trompa de falopio (embarazo ectópico).  · Se han desarrollado pequeños quistes en el útero en lugar de tejido de embarazo (embarazo molar).  INSTRUCCIONES PARA EL CUIDADO EN EL HOGAR   Controle su afección para ver si hay cambios. Las siguientes indicaciones ayudarán a aliviar cualquier molestia que pueda sentir:  · Siga las indicaciones del médico para restringir su actividad. Si el médico le indica descanso en la cama, debe quedarse en la cama y levantarse solo para ir al baño. No obstante, el médico puede permitirle que continué con tareas livianas.  · Si es necesario, organícese para que alguien le ayude con las actividades y responsabilidades cotidianas mientras está en cama.  · Lleve un registro de la cantidad y la saturación de las toallas higiénicas que utiliza cada día. Anote este dato.  · No use tampones.No se haga duchas vaginales.  · No tenga relaciones sexuales u orgasmos hasta que el médico la autorice.  · Si elimina tejido por la vagina, guárdelo para mostrárselo al  médico.  · Tome solo medicamentos de venta libre o recetados, según las indicaciones del médico.  · No tome aspirina, ya que puede causar hemorragias.  · Cumpla con todas las visitas de control, según le indique su médico.  SOLICITE ATENCIÓN MÉDICA SI:  · Tiene un sangrado vaginal en cualquier momento del embarazo.  · Tiene calambres o dolores de parto.  SOLICITE ATENCIÓN MÉDICA DE INMEDIATO SI:   · Siente calambres intensos en la espalda o en el vientre (abdomen).  · Tiene fiebre que los medicamentos no pueden controlar.  · Elimina coágulos grandes o tejido por la vagina.  · La hemorragia aumenta.  · Si se siente mareada, débil o se desmaya.  · Tiene escalofríos.  · Tiene una pérdida importante o sale líquido a borbotones por la vagina.  · Se desmaya mientras defeca.  ASEGÚRESE DE QUE:  · Comprende estas instrucciones.  · Controlará su afección.  · Recibirá ayuda de inmediato si no mejora o si empeora.  Document Released: 05/25/2005 Document Revised: 06/05/2013  ExitCare® Patient Information ©2014 [employer].

## 2014-01-18 NOTE — MAU Note (Signed)
Pt states LMP-11/21/2013.

## 2014-01-18 NOTE — MAU Provider Note (Signed)
  History     CSN: [id number]  Arrival date and time: 01/18/14 1028   None     Chief Complaint  Patient presents with  . Vaginal Bleeding   HPI  38 yo [id number] @ [redacted]w[redacted]d by LMP who presents for scant vaginal bleeding and abdominal pian. Pt has a hx of a ruptured ectopic in 2013 requiring laparoscopic left salpingectomy.  States that this time has had 3-4 days of lower abdominal discomfort. Not severe but constant and only made better by laying still.  Also in the same amount of time she has noticed some pink and brown blood every time she wipes. Two days ago, she had bleeding on her underwear but since then only with wiping.    Denies any fevers, chills, nausea, vomiting, diarrhea, vaginal discharge, cp, sob.    OB History   Grav Para Term Preterm Abortions TAB SAB Ect Mult Living   5 2 2  2  0 1 1 0 2      Past Medical History  Diagnosis Date  . No pertinent past medical history     Past Surgical History  Procedure Laterality Date  . No past surgeries    . Laparoscopy  04/26/2012    Procedure: LAPAROSCOPY OPERATIVE;  Surgeon: Mora Bellman, MD;  Location: [facility];  Service: Gynecology;  Laterality: N/A;  Left Salpingectomy    History reviewed. No pertinent family history.  History  Substance Use Topics  . Smoking status: Never Smoker   . Smokeless tobacco: Not on file  . Alcohol Use: No    Allergies: No Known Allergies  No prescriptions prior to admission    Review of Systems  All other systems reviewed and are negative.  Physical Exam   Blood pressure 104/85, pulse 68, temperature 98.6 F (37 C), temperature source Oral, resp. rate 18, last menstrual period 11/21/2013.  Physical Exam  Constitutional: She appears well-developed and well-nourished.  Appears comfortable in bed   HENT:  Head: Normocephalic and atraumatic.  Eyes: Pupils are equal, round, and reactive to light.  Neck: Neck supple.  Cardiovascular: Normal rate, regular rhythm and normal heart  sounds.   Respiratory: Effort normal and breath sounds normal.  GI: Soft. Bowel sounds are normal. She exhibits no distension. There is no tenderness. There is no rebound and no guarding.  Genitourinary: Uterus is enlarged and tender (slight). Cervix exhibits no motion tenderness and no friability. Right adnexum displays no mass and no tenderness. Left adnexum displays no mass and no tenderness. There is bleeding (scant amount of old dried blood in the posterior fornix. no active bleeding) around the vagina.  Musculoskeletal: Normal range of motion.  Neurological: She is alert.  Skin: Skin is warm and dry. No rash noted.    MAU Course  Procedures  MDM Cbc O+ blood type hcg quant - [id number]   Assessment and Plan  38 yo [id number] @ [redacted]w[redacted]d by LMP who presents today for slight vaginal bleeding and abdominal pain.   Concerning for possible ectopic pregnancy. Rh + from prior visits CBC showing hgb of 12.9 [place] of [address] corresponding to GA.    US obtained to confirm IUP and showed: viable IUP with HR @[redacted]w[redacted]d .    PNV given Will f/u at the [facility] HD Bleeding precautions and return precautions discussed.    Saylor Sheckler L Achaia Garlock 01/18/2014, 11:34 AM

## 2014-01-20 LAB — GC/CHLAMYDIA PROBE AMP
CT Probe RNA: NEGATIVE
GC PROBE AMP APTIMA: NEGATIVE

## 2014-03-03 ENCOUNTER — Encounter: Payer: Self-pay | Admitting: Medical

## 2014-03-10 ENCOUNTER — Other Ambulatory Visit (HOSPITAL_COMMUNITY): Payer: Self-pay | Admitting: Physician Assistant

## 2014-03-10 DIAGNOSIS — Z3689 Encounter for other specified antenatal screening: Secondary | ICD-10-CM

## 2014-03-10 LAB — OB RESULTS CONSOLE RPR: RPR: NONREACTIVE

## 2014-03-10 LAB — OB RESULTS CONSOLE ABO/RH: RH Type: POSITIVE

## 2014-03-10 LAB — OB RESULTS CONSOLE HEPATITIS B SURFACE ANTIGEN: HEP B S AG: NEGATIVE

## 2014-03-10 LAB — OB RESULTS CONSOLE HIV ANTIBODY (ROUTINE TESTING): HIV: NONREACTIVE

## 2014-03-10 LAB — OB RESULTS CONSOLE ANTIBODY SCREEN: Antibody Screen: NEGATIVE

## 2014-03-10 LAB — OB RESULTS CONSOLE GC/CHLAMYDIA
Chlamydia: NEGATIVE
Gonorrhea: NEGATIVE

## 2014-03-10 LAB — OB RESULTS CONSOLE RUBELLA ANTIBODY, IGM: Rubella: IMMUNE

## 2014-04-15 ENCOUNTER — Ambulatory Visit (HOSPITAL_COMMUNITY)
Admission: RE | Admit: 2014-04-15 | Discharge: 2014-04-15 | Disposition: A | Discharge status: Home / Self Care | Payer: Medicaid Other | Source: Ambulatory Visit | Attending: Physician Assistant | Admitting: Physician Assistant

## 2014-04-15 DIAGNOSIS — Z3689 Encounter for other specified antenatal screening: Secondary | ICD-10-CM | POA: Diagnosis not present

## 2014-04-15 DIAGNOSIS — O09529 Supervision of elderly multigravida, unspecified trimester: Secondary | ICD-10-CM | POA: Diagnosis not present

## 2014-04-15 DIAGNOSIS — O09522 Supervision of elderly multigravida, second trimester: Secondary | ICD-10-CM

## 2014-04-16 ENCOUNTER — Other Ambulatory Visit (HOSPITAL_COMMUNITY): Payer: Self-pay | Admitting: Physician Assistant

## 2014-04-16 DIAGNOSIS — Z0489 Encounter for examination and observation for other specified reasons: Secondary | ICD-10-CM

## 2014-04-16 DIAGNOSIS — IMO0002 Reserved for concepts with insufficient information to code with codable children: Secondary | ICD-10-CM

## 2014-05-20 ENCOUNTER — Ambulatory Visit (HOSPITAL_COMMUNITY)
Admission: RE | Admit: 2014-05-20 | Discharge: 2014-05-20 | Disposition: A | Discharge status: Home / Self Care | Payer: Medicaid Other | Source: Ambulatory Visit | Attending: Physician Assistant | Admitting: Physician Assistant

## 2014-05-20 DIAGNOSIS — O358XX0 Maternal care for other (suspected) fetal abnormality and damage, not applicable or unspecified: Secondary | ICD-10-CM | POA: Insufficient documentation

## 2014-05-20 DIAGNOSIS — IMO0002 Reserved for concepts with insufficient information to code with codable children: Secondary | ICD-10-CM

## 2014-05-20 DIAGNOSIS — Z363 Encounter for antenatal screening for malformations: Secondary | ICD-10-CM | POA: Insufficient documentation

## 2014-05-20 DIAGNOSIS — Z1389 Encounter for screening for other disorder: Secondary | ICD-10-CM | POA: Insufficient documentation

## 2014-05-20 DIAGNOSIS — O09522 Supervision of elderly multigravida, second trimester: Secondary | ICD-10-CM

## 2014-05-20 DIAGNOSIS — Z0489 Encounter for examination and observation for other specified reasons: Secondary | ICD-10-CM

## 2014-05-20 DIAGNOSIS — O09529 Supervision of elderly multigravida, unspecified trimester: Secondary | ICD-10-CM | POA: Insufficient documentation

## 2014-06-30 ENCOUNTER — Encounter (HOSPITAL_COMMUNITY): Payer: Self-pay

## 2014-08-14 LAB — OB RESULTS CONSOLE GBS: GBS: NEGATIVE

## 2014-08-14 LAB — OB RESULTS CONSOLE GC/CHLAMYDIA
CHLAMYDIA, DNA PROBE: NEGATIVE
GC PROBE AMP, GENITAL: NEGATIVE

## 2014-08-29 NOTE — L&D Delivery Note (Signed)
Operative Delivery Note At 7:04 PM a viable female was delivered via Vaginal, Vacuum Neurosurgeon).  Presentation: vertex; Position: Transverse; Station: +2.  Verbal consent: obtained from patient.  Risks and benefits discussed in detail.  Risks include, but are not limited to the risks of anesthesia, bleeding, infection, damage to maternal tissues, fetal cephalhematoma.  There is also the risk of inability to effect vaginal delivery of the head, or shoulder dystocia that cannot be resolved by established maneuvers, leading to the need for emergency cesarean section.  APGAR: 8, 9; weight  .   Placenta status: Intact, Spontaneous.   Cord: 3 vessels with the following complications: none.  Cord avulsed while placenta sitting in vagina, placenta extracted without complication from vagina, not true manual extraction.  Anesthesia: Epidural  Instruments: Kiwi vacuum Episiotomy: None Lacerations: Labial Suture Repair: 3.0 vicryl rapide Est. Blood Loss (mL):  283mL  Mom to postpartum.  Baby to Couplet care / Skin to Skin.  Jurnei Latini ROCIO 09/16/2014, 7:53 PM

## 2014-09-08 ENCOUNTER — Other Ambulatory Visit (HOSPITAL_COMMUNITY): Payer: Self-pay | Admitting: Nurse Practitioner

## 2014-09-08 DIAGNOSIS — O480 Post-term pregnancy: Secondary | ICD-10-CM

## 2014-09-10 ENCOUNTER — Other Ambulatory Visit (HOSPITAL_COMMUNITY): Payer: Self-pay | Admitting: Nurse Practitioner

## 2014-09-11 ENCOUNTER — Telehealth (HOSPITAL_COMMUNITY): Payer: Self-pay | Admitting: *Deleted

## 2014-09-11 ENCOUNTER — Ambulatory Visit (HOSPITAL_COMMUNITY): Payer: Medicaid Other

## 2014-09-11 ENCOUNTER — Encounter (HOSPITAL_COMMUNITY): Payer: Self-pay | Admitting: *Deleted

## 2014-09-11 ENCOUNTER — Ambulatory Visit (HOSPITAL_COMMUNITY)
Admission: RE | Admit: 2014-09-11 | Discharge: 2014-09-11 | Disposition: A | Discharge status: Home / Self Care | Payer: Medicaid Other | Source: Ambulatory Visit | Attending: Nurse Practitioner | Admitting: Nurse Practitioner

## 2014-09-11 DIAGNOSIS — O480 Post-term pregnancy: Secondary | ICD-10-CM | POA: Insufficient documentation

## 2014-09-11 DIAGNOSIS — Z3A40 40 weeks gestation of pregnancy: Secondary | ICD-10-CM | POA: Insufficient documentation

## 2014-09-11 NOTE — Telephone Encounter (Signed)
Preadmission screen [id number] interpreter number

## 2014-09-16 ENCOUNTER — Inpatient Hospital Stay (HOSPITAL_COMMUNITY)
Admission: RE | Admit: 2014-09-16 | Discharge: 2014-09-18 | DRG: 775 | Disposition: A | Discharge status: Home / Self Care | Payer: Medicaid Other | Source: Ambulatory Visit | Attending: Obstetrics and Gynecology | Admitting: Obstetrics and Gynecology

## 2014-09-16 ENCOUNTER — Encounter (HOSPITAL_COMMUNITY): Payer: Self-pay

## 2014-09-16 ENCOUNTER — Inpatient Hospital Stay (HOSPITAL_COMMUNITY): Payer: Medicaid Other | Admitting: Anesthesiology

## 2014-09-16 DIAGNOSIS — O480 Post-term pregnancy: Secondary | ICD-10-CM | POA: Diagnosis present

## 2014-09-16 DIAGNOSIS — O09523 Supervision of elderly multigravida, third trimester: Secondary | ICD-10-CM

## 2014-09-16 DIAGNOSIS — O481 Prolonged pregnancy: Secondary | ICD-10-CM | POA: Diagnosis present

## 2014-09-16 DIAGNOSIS — Z3A41 41 weeks gestation of pregnancy: Secondary | ICD-10-CM | POA: Diagnosis present

## 2014-09-16 LAB — CBC
HCT: 40.3 % (ref 36.0–46.0)
Hemoglobin: 13.6 g/dL (ref 12.0–15.0)
MCH: 27.9 pg (ref 26.0–34.0)
MCHC: 33.7 g/dL (ref 30.0–36.0)
MCV: 82.6 fL (ref 78.0–100.0)
Platelets: 138 10*3/uL — ABNORMAL LOW (ref 150–400)
RBC: 4.88 MIL/uL (ref 3.87–5.11)
RDW: 15.3 % (ref 11.5–15.5)
WBC: 5.9 10*3/uL (ref 4.0–10.5)

## 2014-09-16 LAB — TYPE AND SCREEN
ABO/RH(D): O POS
ANTIBODY SCREEN: NEGATIVE

## 2014-09-16 MED ORDER — FENTANYL 2.5 MCG/ML BUPIVACAINE 1/10 % EPIDURAL INFUSION (WH - ANES)
14.0000 mL/h | INTRAMUSCULAR | Status: DC | PRN
  Administered 2014-09-16: 14 mL/h via EPIDURAL
  Filled 2014-09-16: qty 125

## 2014-09-16 MED ORDER — OXYCODONE-ACETAMINOPHEN 5-325 MG PO TABS: 2.0000 | ORAL_TABLET | ORAL | Status: DC | PRN

## 2014-09-16 MED ORDER — LACTATED RINGERS IV SOLN
INTRAVENOUS | Status: DC
  Administered 2014-09-16 (×3): via INTRAVENOUS

## 2014-09-16 MED ORDER — PRENATAL MULTIVITAMIN CH
1.0000 | ORAL_TABLET | Freq: Every day | ORAL | Status: DC
  Administered 2014-09-17: 1 via ORAL
  Filled 2014-09-16: qty 1

## 2014-09-16 MED ORDER — EPHEDRINE 5 MG/ML INJ
10.0000 mg | INTRAVENOUS | Status: DC | PRN
  Filled 2014-09-16: qty 2

## 2014-09-16 MED ORDER — IBUPROFEN 600 MG PO TABS
600.0000 mg | ORAL_TABLET | Freq: Four times a day (QID) | ORAL | Status: DC
  Administered 2014-09-16 – 2014-09-18 (×6): 600 mg via ORAL
  Filled 2014-09-16 (×6): qty 1

## 2014-09-16 MED ORDER — OXYTOCIN 40 UNITS IN LACTATED RINGERS INFUSION - SIMPLE MED: 62.5000 mL/h | INTRAVENOUS | Status: DC

## 2014-09-16 MED ORDER — PHENYLEPHRINE 40 MCG/ML (10ML) SYRINGE FOR IV PUSH (FOR BLOOD PRESSURE SUPPORT)
80.0000 ug | PREFILLED_SYRINGE | INTRAVENOUS | Status: DC | PRN
  Filled 2014-09-16: qty 20
  Filled 2014-09-16: qty 2

## 2014-09-16 MED ORDER — CITRIC ACID-SODIUM CITRATE 334-500 MG/5ML PO SOLN: 30.0000 mL | ORAL | Status: DC | PRN

## 2014-09-16 MED ORDER — PHENYLEPHRINE 40 MCG/ML (10ML) SYRINGE FOR IV PUSH (FOR BLOOD PRESSURE SUPPORT)
80.0000 ug | PREFILLED_SYRINGE | INTRAVENOUS | Status: DC | PRN
  Filled 2014-09-16: qty 2

## 2014-09-16 MED ORDER — OXYCODONE-ACETAMINOPHEN 5-325 MG PO TABS: 1.0000 | ORAL_TABLET | ORAL | Status: DC | PRN

## 2014-09-16 MED ORDER — TERBUTALINE SULFATE 1 MG/ML IJ SOLN
0.2500 mg | Freq: Once | INTRAMUSCULAR | Status: DC | PRN
  Filled 2014-09-16: qty 1

## 2014-09-16 MED ORDER — DIPHENHYDRAMINE HCL 50 MG/ML IJ SOLN: 12.5000 mg | INTRAMUSCULAR | Status: DC | PRN

## 2014-09-16 MED ORDER — LACTATED RINGERS IV SOLN
500.0000 mL | Freq: Once | INTRAVENOUS | Status: AC
  Administered 2014-09-16: 500 mL via INTRAVENOUS

## 2014-09-16 MED ORDER — OXYTOCIN BOLUS FROM INFUSION
500.0000 mL | INTRAVENOUS | Status: DC
  Administered 2014-09-16: 500 mL via INTRAVENOUS

## 2014-09-16 MED ORDER — WITCH HAZEL-GLYCERIN EX PADS: 1.0000 "application " | MEDICATED_PAD | CUTANEOUS | Status: DC | PRN

## 2014-09-16 MED ORDER — LIDOCAINE HCL (PF) 1 % IJ SOLN
INTRAMUSCULAR | Status: DC | PRN
  Administered 2014-09-16 (×2): 5 mL

## 2014-09-16 MED ORDER — ACETAMINOPHEN 325 MG PO TABS: 650.0000 mg | ORAL_TABLET | ORAL | Status: DC | PRN

## 2014-09-16 MED ORDER — TETANUS-DIPHTH-ACELL PERTUSSIS 5-2.5-18.5 LF-MCG/0.5 IM SUSP: 0.5000 mL | Freq: Once | INTRAMUSCULAR | Status: DC

## 2014-09-16 MED ORDER — LIDOCAINE HCL (PF) 1 % IJ SOLN
30.0000 mL | INTRAMUSCULAR | Status: DC | PRN
  Filled 2014-09-16: qty 30

## 2014-09-16 MED ORDER — SENNOSIDES-DOCUSATE SODIUM 8.6-50 MG PO TABS
2.0000 | ORAL_TABLET | ORAL | Status: DC
  Administered 2014-09-16 – 2014-09-17 (×2): 2 via ORAL
  Filled 2014-09-16 (×2): qty 2

## 2014-09-16 MED ORDER — LANOLIN HYDROUS EX OINT: TOPICAL_OINTMENT | CUTANEOUS | Status: DC | PRN

## 2014-09-16 MED ORDER — DIPHENHYDRAMINE HCL 25 MG PO CAPS: 25.0000 mg | ORAL_CAPSULE | Freq: Four times a day (QID) | ORAL | Status: DC | PRN

## 2014-09-16 MED ORDER — OXYTOCIN 40 UNITS IN LACTATED RINGERS INFUSION - SIMPLE MED
1.0000 m[IU]/min | INTRAVENOUS | Status: DC
  Administered 2014-09-16: 2 m[IU]/min via INTRAVENOUS
  Filled 2014-09-16: qty 1000

## 2014-09-16 MED ORDER — LACTATED RINGERS IV SOLN
500.0000 mL | INTRAVENOUS | Status: DC | PRN
  Administered 2014-09-16: 500 mL via INTRAVENOUS

## 2014-09-16 MED ORDER — BENZOCAINE-MENTHOL 20-0.5 % EX AERO
1.0000 "application " | INHALATION_SPRAY | CUTANEOUS | Status: DC | PRN
  Administered 2014-09-17: 1 via TOPICAL
  Filled 2014-09-16: qty 56

## 2014-09-16 MED ORDER — SIMETHICONE 80 MG PO CHEW: 80.0000 mg | CHEWABLE_TABLET | ORAL | Status: DC | PRN

## 2014-09-16 MED ORDER — ONDANSETRON HCL 4 MG PO TABS: 4.0000 mg | ORAL_TABLET | ORAL | Status: DC | PRN

## 2014-09-16 MED ORDER — ONDANSETRON HCL 4 MG/2ML IJ SOLN: 4.0000 mg | INTRAMUSCULAR | Status: DC | PRN

## 2014-09-16 MED ORDER — DIBUCAINE 1 % RE OINT: 1.0000 "application " | TOPICAL_OINTMENT | RECTAL | Status: DC | PRN

## 2014-09-16 MED ORDER — ONDANSETRON HCL 4 MG/2ML IJ SOLN: 4.0000 mg | Freq: Four times a day (QID) | INTRAMUSCULAR | Status: DC | PRN

## 2014-09-16 NOTE — Anesthesia Procedure Notes (Signed)
Epidural Patient location during procedure: OB Start time: 09/16/2014 3:07 PM  Staffing Anesthesiologist: Rudean Curt Performed by: anesthesiologist   Preanesthetic Checklist Completed: patient identified, site marked, surgical consent, pre-op evaluation, timeout performed, IV checked, risks and benefits discussed and monitors and equipment checked  Epidural Patient position: sitting Prep: site prepped and draped and DuraPrep Patient monitoring: continuous pulse ox and blood pressure Approach: midline Location: L3-L4 Injection technique: LOR air  Needle:  Needle type: Tuohy  Needle gauge: 17 G Needle length: 9 cm and 9 Needle insertion depth: 5 cm cm Catheter type: closed end flexible Catheter size: 19 Gauge Catheter at skin depth: 10 cm Test dose: negative  Assessment Events: blood not aspirated, injection not painful, no injection resistance, negative IV test and no paresthesia  Additional Notes Patient identified.  Risk benefits discussed including failed block, incomplete pain control, headache, nerve damage, paralysis, blood pressure changes, nausea, vomiting, reactions to medication both toxic or allergic, and postpartum back pain.  Patient expressed understanding and wished to proceed.  All questions were answered.  Sterile technique used throughout procedure and epidural site dressed with sterile barrier dressing. No paresthesia or other complications noted.The patient did not experience any signs of intravascular injection such as tinnitus or metallic taste in mouth nor signs of intrathecal spread such as rapid motor block. Please see nursing notes for vital signs.

## 2014-09-16 NOTE — Progress Notes (Signed)
I assisted Chelsea, RN with questions.  [place] Interpreter.

## 2014-09-16 NOTE — Progress Notes (Signed)
Interpreter, [employer], at bedside.

## 2014-09-16 NOTE — Anesthesia Preprocedure Evaluation (Signed)
Anesthesia Evaluation  Patient identified by MRN, date of birth, ID band Patient awake    Reviewed: Allergy & Precautions, H&P , Patient's Chart, lab work & pertinent test results  Airway Mallampati: II  TM Distance: >3 FB Neck ROM: full    Dental   Pulmonary  breath sounds clear to auscultation        Cardiovascular Rhythm:regular Rate:Normal     Neuro/Psych    GI/Hepatic   Endo/Other    Renal/GU      Musculoskeletal   Abdominal   Peds  Hematology   Anesthesia Other Findings   Reproductive/Obstetrics (+) Pregnancy                             Anesthesia Physical Anesthesia Plan  ASA: II  Anesthesia Plan: Epidural   Post-op Pain Management:    Induction:   Airway Management Planned:   Additional Equipment:   Intra-op Plan:   Post-operative Plan:   Informed Consent: I have reviewed the patients History and Physical, chart, labs and discussed the procedure including the risks, benefits and alternatives for the proposed anesthesia with the patient or authorized representative who has indicated his/her understanding and acceptance.     Plan Discussed with:   Anesthesia Plan Comments:         Anesthesia Quick Evaluation

## 2014-09-16 NOTE — Progress Notes (Signed)
Interpreter, Marnette Burgess, at bedside.

## 2014-09-16 NOTE — H&P (Signed)
Morgan Singleton is a 39 y.o. female [id number] at [redacted]w[redacted]d presenting for induction of labor for prolonged pregnancy. Pt desires to breastfeed. She wants epidural. Plan for contraception is condoms and birth control pill. . Maternal Medical History:  Reason for admission: Nausea.  Fetal activity: Perceived fetal activity is normal.   Last perceived fetal movement was within the past hour.    Prenatal complications: No bleeding, pre-eclampsia or preterm labor.   Prenatal Complications - Diabetes: none.    OB History    Gravida Para Term Preterm AB TAB SAB Ectopic Multiple Living   5 2 2  2  0 1 1 0 2     Past Medical History  Diagnosis Date  . No pertinent past medical history   . AMA (advanced maternal age) multigravida 35+    Past Surgical History  Procedure Laterality Date  . No past surgeries    . Laparoscopy  04/26/2012    Procedure: LAPAROSCOPY OPERATIVE;  Surgeon: Morgan Bellman, MD;  Location: [facility];  Service: Gynecology;  Laterality: N/A;  Left Salpingectomy   Family History: family history is not on file. Social History:  reports that she has never smoked. She does not have any smokeless tobacco history on file. She reports that she does not drink alcohol or use illicit drugs.   Prenatal Transfer Tool  OB MD Prenatal Transfer Tool    09/16/14 1710   Maternal Diabetes  Maternal Diabetes  No         Genetic Screening  Genetic Screens  Normal         Maternal Ultrasounds/Referrals  Maternal Ultrasounds  Normal         Fetal US or Other Referrals  Fetal US or Other Referrals  None         Substance Abuse  Maternal substance abuse  No         Significant Maternal Labs  Maternal Significant Labs  No            Review of Systems  Constitutional: Negative for fever and chills.  Cardiovascular: Negative for chest pain.  Gastrointestinal: Negative for nausea, vomiting and abdominal pain.  Neurological: Negative for dizziness.    Dilation: 4.5 Effacement (%):  70 Station: -1 Exam by:: Dr. Deniece Singleton Blood pressure 128/74, pulse 63, temperature 97.8 F (36.6 C), temperature source Oral, resp. rate 18, height 4\' 11"  (1.499 m), weight 62.143 kg (137 lb), last menstrual period 11/21/2013, SpO2 100 %. Maternal Exam:  Abdomen: Patient reports no abdominal tenderness. Fetal presentation: vertex  Introitus: Normal vulva. Normal vagina.  Cervix: Cervix evaluated by digital exam.     Fetal Exam Fetal Monitor Review: Variability: moderate (6-25 bpm).       Physical Exam  Constitutional: She appears well-developed and well-nourished.  Cardiovascular: Normal rate and regular rhythm.   Respiratory: Effort normal and breath sounds normal.    Prenatal labs: ABO, Rh: --/--/O POS (01/19 9450) Antibody: NEG (01/19 3888) Rubella: Immune (07/13 0000) RPR: Nonreactive (07/13 0000)  HBsAg: Negative (07/13 0000)  HIV: Non-reactive (07/13 0000)  GBS: Negative (12/17 0000)   Assessment/Plan: 39 yo female [id number] at [redacted]w[redacted]d here for IOL for prolonged pregnancy.  # IOL for prolonged pregnancy - pit started - expectant management - cervical check 4/50/-1  # pain management- epidural  # contraception- condoms, OCP  Morgan Singleton 09/16/2014, 5:07 PM  OB fellow attestation:  I have seen and examined this patient; I agree with above documentation in the resident's note.   Morgan Singleton  is a 39 y.o. Morgan Singleton here for IOL 2/2 prolonged pregnancy  PE: BP 125/87 mmHg  Pulse 81  Temp(Src) 97.9 F (36.6 C) (Oral)  Resp 20  Ht 4\' 11"  (1.499 m)  Wt 137 lb (62.143 kg)  BMI 27.66 kg/m2  SpO2 100%  LMP 11/21/2013  Breastfeeding? Unknown Gen: calm comfortable, NAD Resp: normal effort, no distress Abd: gravid  ROS, labs, PMH reviewed  Plan: MOF: breast MOC: OCPs and condoms ID: GBS neg FWB: cat I Labor: start pitocin Circ: n/a Pain: epidural upon request  Morgan Singleton Morgan Singleton 09/16/2014, 8:16 PM

## 2014-09-16 NOTE — Progress Notes (Signed)
I assisted Chelsea, RN and FP with explanation of care plan.  [place]  Interpreter.

## 2014-09-17 LAB — RPR: RPR Ser Ql: NONREACTIVE

## 2014-09-17 LAB — HIV ANTIBODY (ROUTINE TESTING W REFLEX)
HIV 1/HIV 2 AB: NONREACTIVE
HIV 1/O/2 Abs-Index Value: <1 (ref <1.00)

## 2014-09-17 NOTE — Progress Notes (Signed)
I stopped by patients room to check on her needs. By Juliann Mule Spanish Interpreter.

## 2014-09-17 NOTE — Anesthesia Postprocedure Evaluation (Signed)
  Anesthesia Post-op Note  Patient: Morgan Singleton  Procedure(s) Performed: * No procedures listed *  Patient Location: Mother/Baby  Anesthesia Type:Epidural  Level of Consciousness: awake  Airway and Oxygen Therapy: Patient Spontanous Breathing  Post-op Pain: mild  Post-op Assessment: Patient's Cardiovascular Status Stable and Respiratory Function Stable  Post-op Vital Signs: stable  Last Vitals:  Filed Vitals:   09/17/14 0640  BP: 109/74  Pulse: 67  Temp: 36.7 C  Resp: 18    Complications: No apparent anesthesia complications

## 2014-09-17 NOTE — Lactation Note (Signed)
This note was copied from the chart of Morgan Singleton. Lactation Consultation Note  Patient Name: Morgan Singleton Today's Date: 09/17/2014 Reason for consult: Initial assessment [place] interpreter [phone] used for visit. Mom reports baby is BF well, denies questions or concerns. She is supplementing small amounts of formula till her milk comes in. [facility] reviewed risk of early supplementation unless medically necessary. Mom reports she is BF with each feeding before giving any supplements. Supplemental guidelines reviewed with Mom. Mom is experienced BF, some basic teaching reviewed. Lactation brochure left for review, advised of OP services and support group. Encouraged to call for questions/concerns or assist with BF.   Maternal Data Has patient been taught Hand Expression?: No (Mom reports she knows how to hand express) Does the patient have breastfeeding experience prior to this delivery?: Yes  Feeding Feeding Type: Bottle Fed - Formula Nipple Type: Slow - flow Length of feed: 15 min  LATCH Score/Interventions                Intervention(s): Breastfeeding basics reviewed     Lactation Tools Discussed/Used WIC Program: Yes   Consult Status Consult Status: Follow-up Date: 09/18/14 Follow-up type: In-patient    Katrine Coho 09/17/2014, 2:23 PM

## 2014-09-17 NOTE — Progress Notes (Signed)
I stopped by to check on patient's needs and ordered her lunch.  [place] Interpreter.

## 2014-09-17 NOTE — Progress Notes (Signed)
I assisted Claiborne Billings, RN with questions. [place]  Interpreter.

## 2014-09-17 NOTE — Progress Notes (Addendum)
Post Partum Day 1 Subjective: no complaints, up ad lib, voiding, tolerating PO, + flatus and bleeding wnl.  no BM yet  Objective: Blood pressure 109/74, pulse 67, temperature 98 F (36.7 C), temperature source Oral, resp. rate 18, height 4\' 11"  (1.499 m), weight 62.143 kg (137 lb), last menstrual period 11/21/2013, SpO2 99 %, unknown if currently breastfeeding.  Physical Exam:  General: alert and cooperative Lochia: appropriate Uterine Fundus: firm Heart and Lungs: RRR and CTA Incision: none DVT Evaluation: No evidence of DVT seen on physical exam.   Recent Labs  09/16/14 0813  HGB 13.6  HCT 40.3    Assessment/Plan: Plan for discharge tomorrow, Breastfeeding and Contraception pills or condoms   LOS: 1 day   Dannielle Huh 09/17/2014, 7:32 AM

## 2014-09-18 NOTE — Progress Notes (Signed)
Ur chart review completed.  

## 2014-09-18 NOTE — Discharge Summary (Signed)
  39 yo female [name] with no sig PMH or complications of pregnancy delivered healthy baby girl. Breastfeeding. Contraception condoms and OCPs.  Obstetric Discharge Summary Reason for Admission: onset of labor Prenatal Procedures: none Intrapartum Procedures: spontaneous vaginal delivery Postpartum Procedures: none Complications-Operative and Postpartum: none HEMOGLOBIN  Date Value Ref Range Status  09/16/2014 13.6 12.0 - 15.0 g/dL Final  04/26/2012 9.8* 12.2 - 16.2 g/dL Final   HCT  Date Value Ref Range Status  09/16/2014 40.3 36.0 - 46.0 % Final   HCT, POC  Date Value Ref Range Status  04/26/2012 32.9* 37.7 - 47.9 % Final    Physical Exam:  General: alert, cooperative and no distress Lochia: appropriate Uterine Fundus: firm Incision: none DVT Evaluation: No evidence of DVT seen on physical exam.  Discharge Diagnoses: Term Pregnancy-delivered  Discharge Information: Date: 09/18/2014 Activity: unrestricted Diet: routine Medications: None Condition: stable Instructions: see attachment Discharge to: home Follow-up Information    Follow up with [facility] [place]. Schedule an appointment as soon as possible for a visit in 6 weeks.   Why:  For your postpartum appointment.   Contact information:   [place] [address] [phone]      Newborn Data: Live born female  Birth Weight: 7 lb 0.4 oz (3185 g) APGAR: 8, 9  Home with mother.  Morgan Singleton 09/18/2014, 7:23 AM   I have seen and examined this patient and I agree with the above. Morgan Singleton CNM 9:28 AM 09/18/2014

## 2014-09-18 NOTE — Progress Notes (Signed)
During my shift I stopped  twice by patients room to check on her needs, by Juliann Mule Interpreter

## 2016-12-25 IMAGING — US US FETAL BPP W/O NONSTRESS
1 series · 13 of 28 positions shown · non-contrast
Comparison: none

[Series 1: us fetal bpp w/o nonstress · non-contrast · 28 acquisitions, 13 frames shown]
[im 2/28]
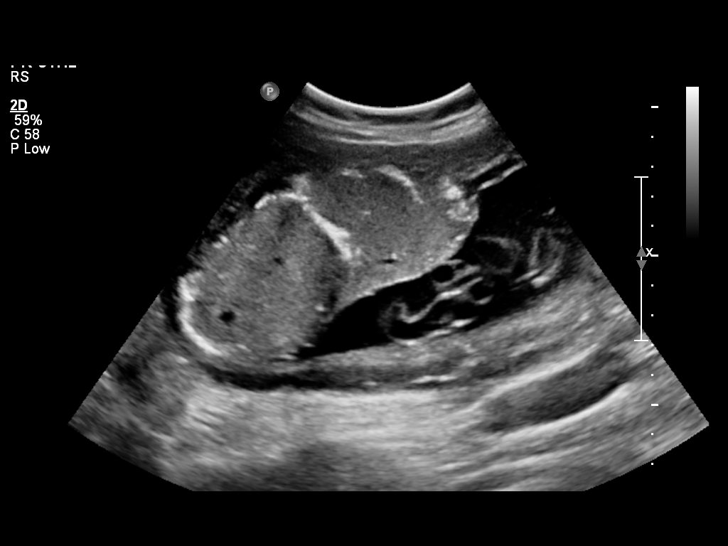
[im 4/28]
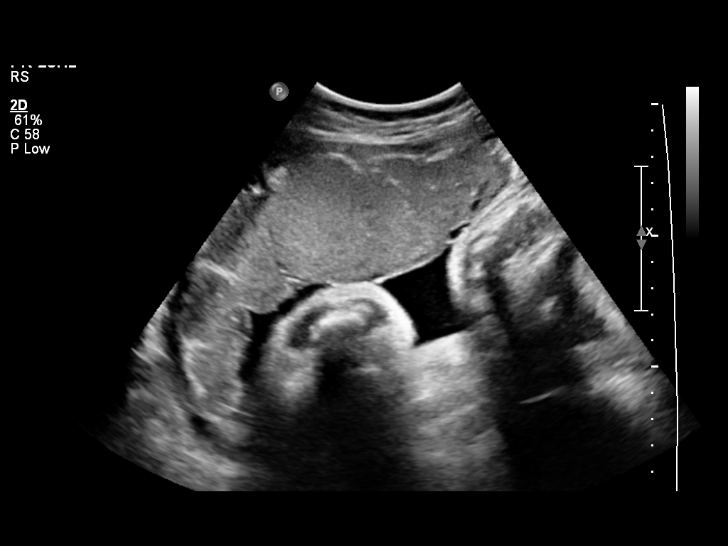
[im 6/28]
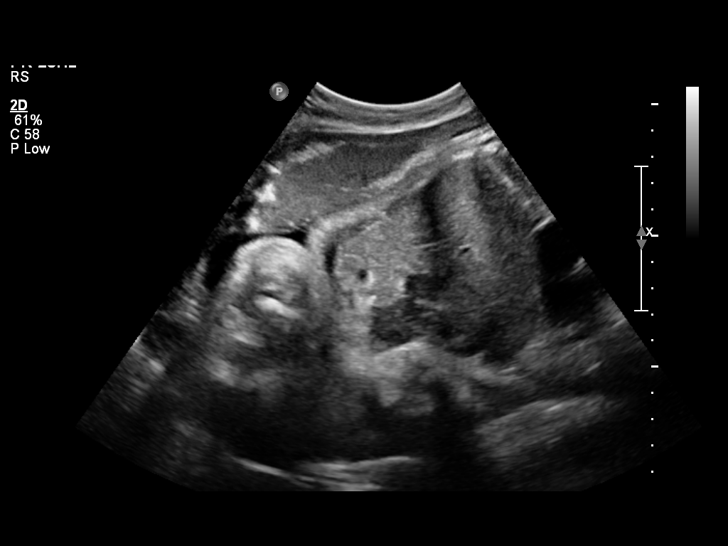
[im 8/28]
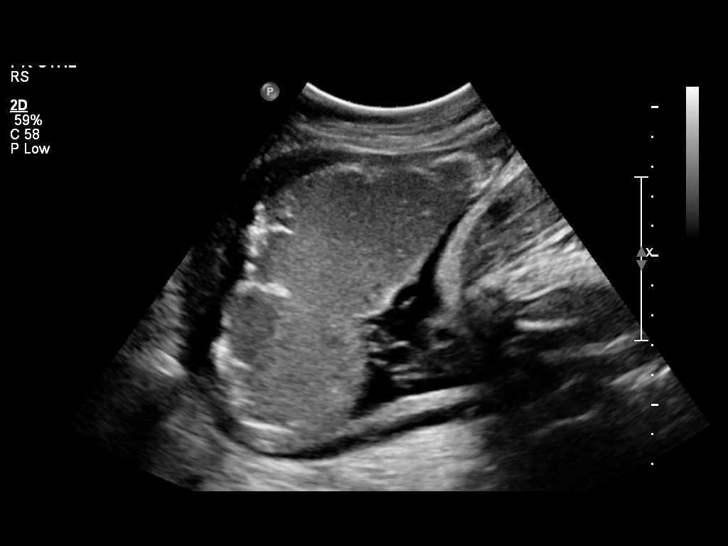
[im 10/28]
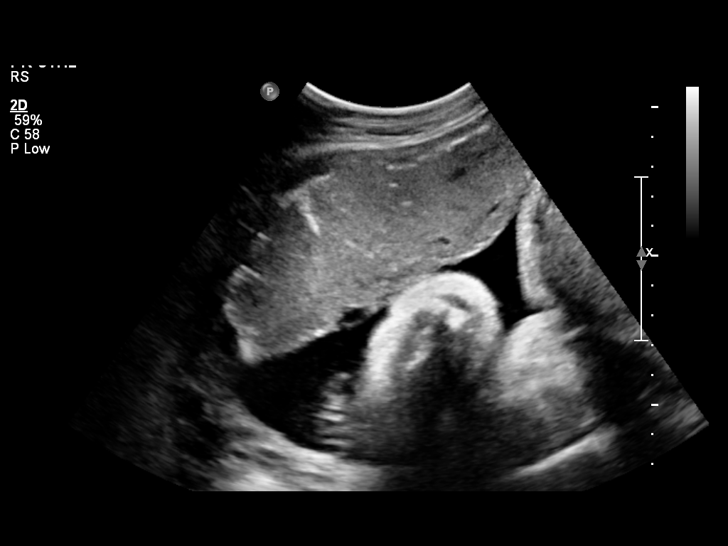
[im 12/28]
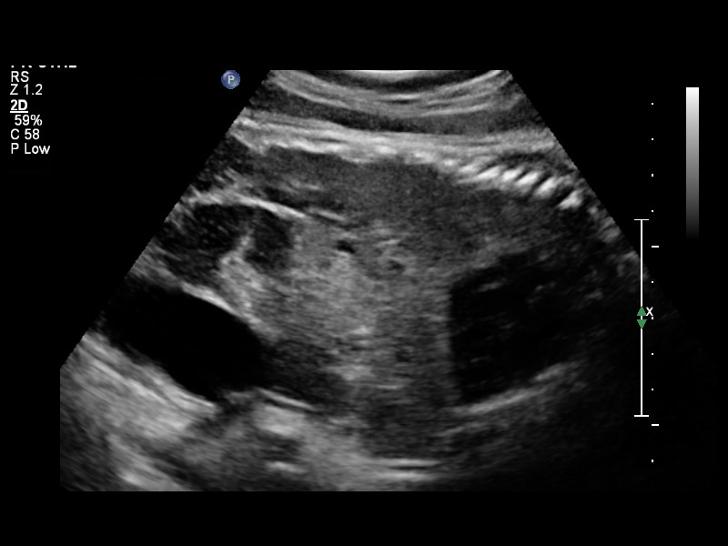
[im 15/28]
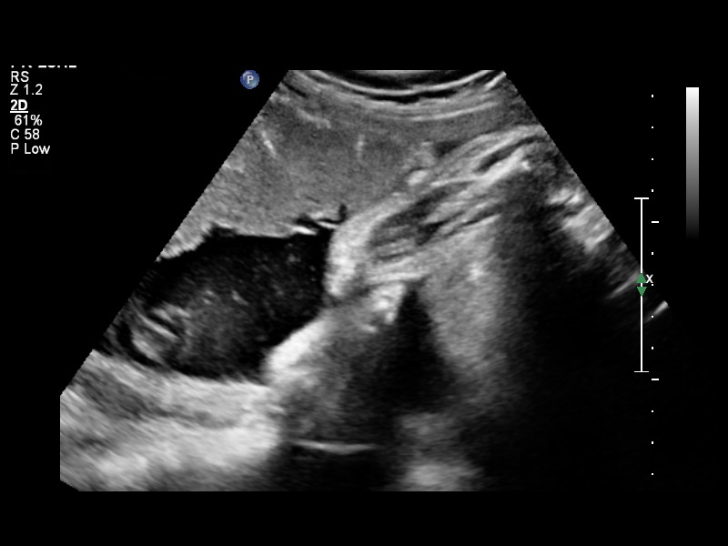
[im 17/28]
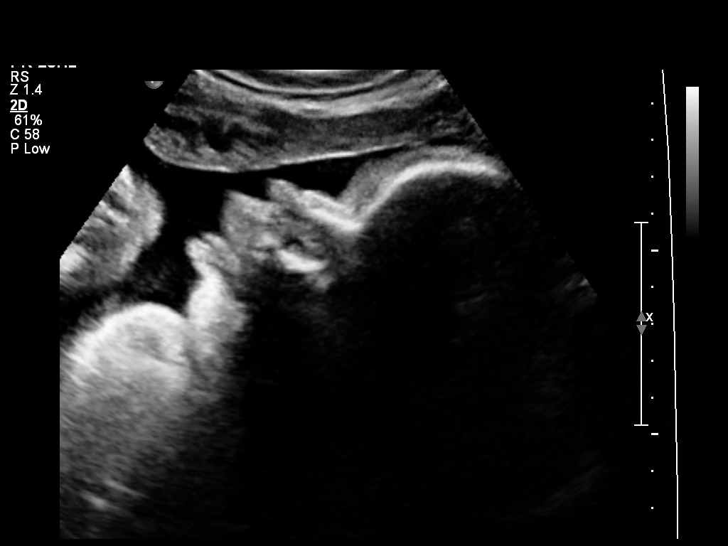
[im 19/28]
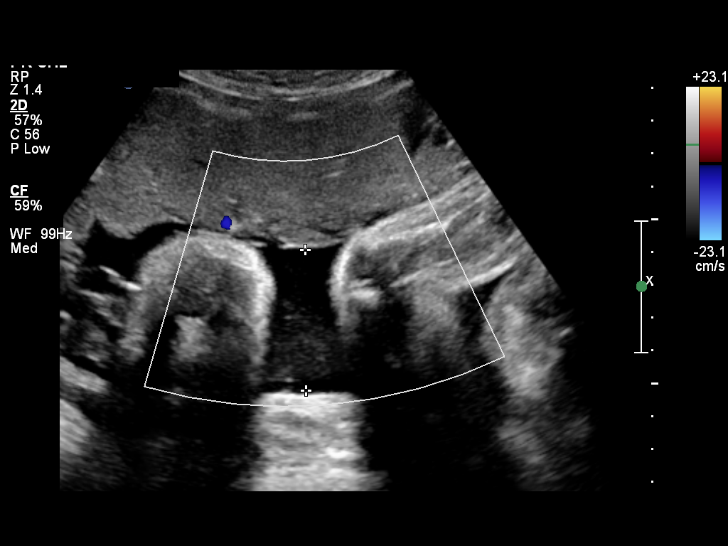
[im 21/28]
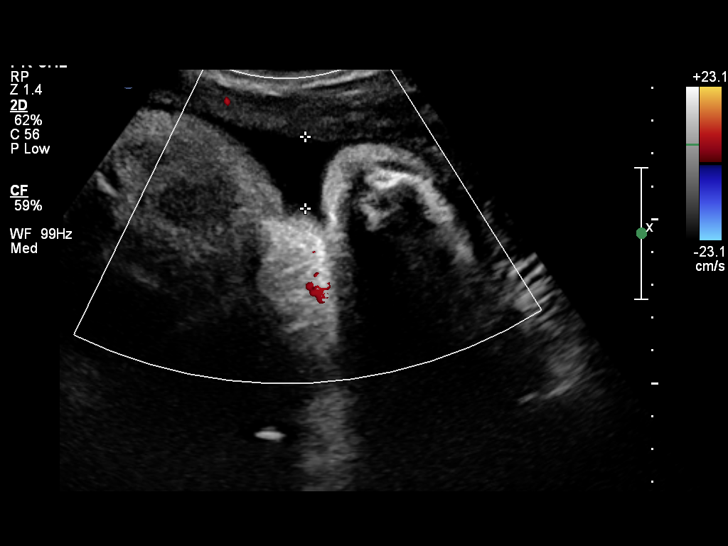
[im 23/28]
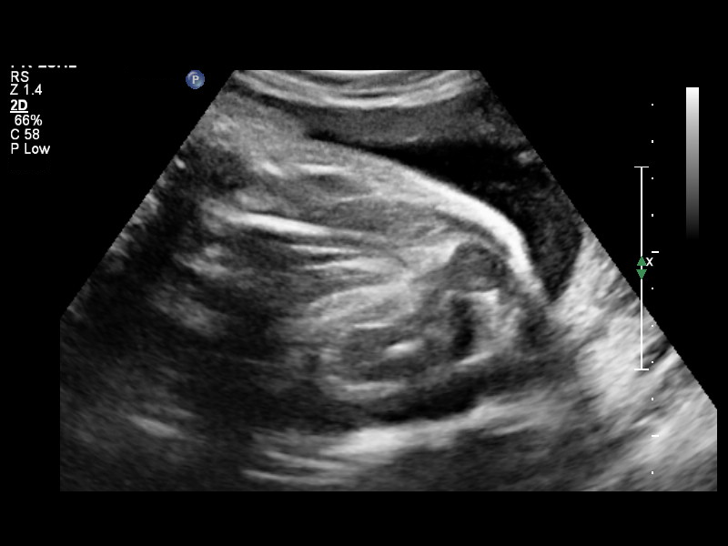
[im 25/28]
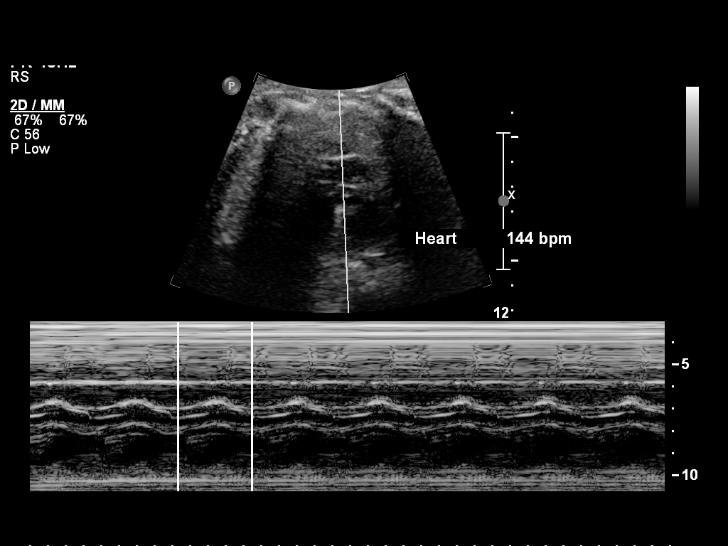
[im 27/28]
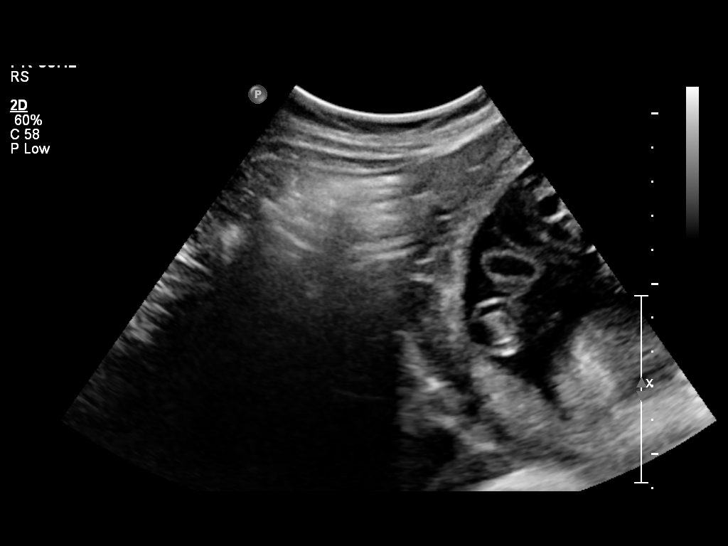

[13 of 28 positions shown; findings below may reference images not displayed]

OBSTETRICS REPORT
                      (Signed Final 09/11/2014 [DATE])

             KLEVER ANIBAL

Service(s) Provided

 [HOSPITAL]                                         76815.0
Indications

 Postdate pregnancy (40-42 weeks)
 Advanced maternal age (AMA), Multigravida (37)
Fetal Evaluation

 Num Of Fetuses:    1
 Fetal Heart Rate:  144                          bpm
 Cardiac Activity:  Observed
 Presentation:      Cephalic
 Placenta:          Anterior Fundal, above
                    cervical os
 P. Cord            Previously Visualized
 Insertion:

 Comment:    BPP [DATE]

 Amniotic Fluid
 AFI FV:      Subjectively within normal limits
 AFI Sum:     12.54   cm       54  %Tile     Larg Pckt:    4.27  cm
 RUQ:   4.27    cm   RLQ:    2.05   cm    LUQ:   4.04    cm   LLQ:    2.18   cm
Biophysical Evaluation

 Amniotic F.V:   Pocket => 2 cm two         F. Tone:        Observed
                 planes
 F. Movement:    Observed                   Score:          [DATE]
 F. Breathing:   Observed
Gestational Age

 LMP:           42w 0d        Date:  11/21/13                 EDD:   08/28/14
 Best:          40w 5d     Det. By:  Early Ultrasound         EDD:   09/06/14
Cervix Uterus Adnexa

 Cervix:       Not visualized (advanced GA >59wks)
 Left Ovary:    Not visualized.
 Right Ovary:   Not visualized.
 Adnexa:     No adnexal mass visualized.
Impression

 Single IUP at 40w 5d
 Cephalic presentation
 Anterior fundal placenta
 BPP [DATE]
 Normal amniotic fluid volume (AFI 12.5 cm)
Recommendations

 Follow-up ultrasounds as clinically indicated.

 KLEVER ANIBAL with us.  Please do not hesitate to

## 2017-08-16 ENCOUNTER — Other Ambulatory Visit (HOSPITAL_COMMUNITY): Payer: Self-pay | Admitting: *Deleted

## 2017-08-16 DIAGNOSIS — M79622 Pain in left upper arm: Secondary | ICD-10-CM

## 2017-09-06 ENCOUNTER — Encounter (HOSPITAL_COMMUNITY): Payer: Self-pay | Admitting: *Deleted

## 2017-09-07 ENCOUNTER — Ambulatory Visit: Payer: Self-pay

## 2017-09-07 ENCOUNTER — Ambulatory Visit (HOSPITAL_COMMUNITY)
Admission: RE | Admit: 2017-09-07 | Discharge: 2017-09-07 | Disposition: A | Discharge status: Home / Self Care | Payer: Self-pay | Source: Ambulatory Visit | Attending: Obstetrics and Gynecology | Admitting: Obstetrics and Gynecology

## 2017-09-07 ENCOUNTER — Encounter (HOSPITAL_COMMUNITY): Payer: Self-pay

## 2017-09-07 ENCOUNTER — Ambulatory Visit
Admission: RE | Admit: 2017-09-07 | Discharge: 2017-09-07 | Disposition: A | Discharge status: Home / Self Care | Payer: No Typology Code available for payment source | Source: Ambulatory Visit | Attending: Obstetrics and Gynecology | Admitting: Obstetrics and Gynecology

## 2017-09-07 VITALS — BP 108/60 | Temp 98.2°F

## 2017-09-07 DIAGNOSIS — Z1239 Encounter for other screening for malignant neoplasm of breast: Secondary | ICD-10-CM

## 2017-09-07 DIAGNOSIS — N644 Mastodynia: Secondary | ICD-10-CM

## 2017-09-07 DIAGNOSIS — M79622 Pain in left upper arm: Secondary | ICD-10-CM

## 2017-09-07 NOTE — Progress Notes (Signed)
Complaints of left upper breast pain x 10 years that comes and goes. Patient rates the pain at a 4-5 out of 10. Complaints of left outer breast pain that happened two months ago one time that patient rates at a 7 out of 10.  Pap Smear: Pap smear not completed today. Last Pap smear was 10/24/2014 at the [facility] Department and normal. Per patient has no history of an abnormal Pap smear. Last Pap smear result is in Epic.  Physical exam: Breasts Breasts symmetrical. No skin abnormalities bilateral breasts. No nipple retraction bilateral breasts. No nipple discharge bilateral breasts. No lymphadenopathy. No lumps palpated bilateral breasts. Complaints of left upper outer breast pain on exam. Referred patient to the [facility] for diagnostic mammogram and possible left breast ultrasound. Appointment scheduled for Thursday, September 07, 2017 at [place].        Pelvic/Bimanual No Pap smear completed today since last Pap smear was 10/24/2014. Pap smear not indicated per [facility] guidelines.   Smoking History: Patient has never smoked.  Patient Navigation: Patient education provided. Access to services provided for patient through [facility] program. Spanish interpreter provided.  Used Spanish interpreter [employer] from [place].

## 2017-09-07 NOTE — Patient Instructions (Signed)
Explained breast self awareness with Joniece Vessel. Patient did not need a Pap smear today due to last Pap smear was 10/24/2014. Let her know [facility] will cover Pap smears every 3 years unless has a history of abnormal Pap smears. Scheduled patient for a Pap smear with [facility] clinic on Tuesday, October 31, 2017 at 1430. Referred patient to the [facility] for diagnostic mammogram and possible left breast ultrasound. Appointment scheduled for Thursday, September 07, 2017 at [place]. Tedra Tetro verbalized understanding.  Aviana Shevlin, Arvil Chaco, RN 1:09 PM

## 2017-09-15 ENCOUNTER — Encounter (HOSPITAL_COMMUNITY): Payer: Self-pay | Admitting: *Deleted

## 2017-10-03 ENCOUNTER — Ambulatory Visit (HOSPITAL_COMMUNITY): Payer: No Typology Code available for payment source

## 2017-10-31 ENCOUNTER — Encounter (HOSPITAL_COMMUNITY): Payer: Self-pay

## 2017-10-31 ENCOUNTER — Ambulatory Visit (HOSPITAL_COMMUNITY)
Admission: RE | Admit: 2017-10-31 | Discharge: 2017-10-31 | Disposition: A | Discharge status: Home / Self Care | Payer: No Typology Code available for payment source | Source: Ambulatory Visit | Attending: Obstetrics and Gynecology | Admitting: Obstetrics and Gynecology

## 2017-10-31 VITALS — BP 100/60 | Ht 59.0 in | Wt 115.0 lb

## 2017-10-31 DIAGNOSIS — Z01419 Encounter for gynecological examination (general) (routine) without abnormal findings: Secondary | ICD-10-CM

## 2017-10-31 NOTE — Progress Notes (Signed)
No complaints today.   Pap Smear: Pap smear not completed today. Last Pap smear was 10/24/2014 at the [facility] Department and normal. Per patient has no history of an abnormal Pap smear. Last Pap smear result is in Epic.  Pelvic/Bimanual   Ext Genitalia No lesions, no swelling and no discharge observed on external genitalia.         Vagina Vagina pink and normal texture. No lesions or discharge observed in vagina.          Cervix Cervix is present. Cervix pink and beefy red around os. No discharge observed.     Uterus Uterus is present and palpable. Uterus in normal position and normal size.        Adnexae Bilateral ovaries present and palpable. No tenderness on palpation.         Rectovaginal No rectal exam completed today since patient had no rectal complaints. No skin abnormalities observed on exam.    Smoking History: Patient has never smoked.  Patient Navigation: Patient education provided. Access to services provided for patient through [facility] program. Spanish interpreter provided.   Breast and Cervical Cancer Risk Assessment: Patient has no family history of breast cancer, known genetic mutations, or radiation treatment to the chest before age 69. Patient has no history of cervical dysplasia, immunocompromised, or DES exposure in-utero.  Used Spanish interpreter Omer Jack from [place].

## 2017-10-31 NOTE — Patient Instructions (Addendum)
Explained to Morgan Singleton that Morgan Singleton will cover Pap smears and HPV typing every 5 years unless has a history of abnormal Pap smears. Let patient know we will follow-up with her within the next couple of weeks with results of Pap smear by letter or phone. Morgan Singleton verbalized understanding.  Oberon Hehir, Arvil Chaco, RN 2:37 PM

## 2017-10-31 NOTE — Progress Notes (Signed)
Error per Rolena Infante, LPN.

## 2017-11-02 LAB — CYTOLOGY - PAP
DIAGNOSIS: NEGATIVE
HPV: NOT DETECTED

## 2017-12-21 ENCOUNTER — Encounter (HOSPITAL_COMMUNITY): Payer: Self-pay | Admitting: *Deleted

## 2017-12-21 NOTE — Progress Notes (Signed)
Letter mailed to patient with negative pap smear results. HPV was negative. Next pap smear due in five years. 

## 2018-09-12 ENCOUNTER — Other Ambulatory Visit (HOSPITAL_COMMUNITY): Payer: Self-pay | Admitting: *Deleted

## 2018-09-12 DIAGNOSIS — Z1231 Encounter for screening mammogram for malignant neoplasm of breast: Secondary | ICD-10-CM

## 2018-11-16 ENCOUNTER — Telehealth (HOSPITAL_COMMUNITY): Payer: Self-pay

## 2018-11-16 NOTE — Telephone Encounter (Signed)
Left message with patient about [facility] appointment that will need to be rescheduled due to [id number]. Left name and number for patient to call back. °

## 2018-11-19 ENCOUNTER — Other Ambulatory Visit (HOSPITAL_COMMUNITY): Payer: Self-pay | Admitting: *Deleted

## 2018-11-19 DIAGNOSIS — Z1231 Encounter for screening mammogram for malignant neoplasm of breast: Secondary | ICD-10-CM

## 2018-11-29 ENCOUNTER — Ambulatory Visit (HOSPITAL_COMMUNITY): Payer: No Typology Code available for payment source

## 2019-02-24 ENCOUNTER — Encounter (HOSPITAL_COMMUNITY): Payer: Self-pay | Admitting: *Deleted

## 2019-02-24 ENCOUNTER — Inpatient Hospital Stay (HOSPITAL_COMMUNITY): Payer: Medicaid Other

## 2019-02-24 ENCOUNTER — Inpatient Hospital Stay (HOSPITAL_COMMUNITY)
Admission: AD | Admit: 2019-02-24 | Discharge: 2019-02-24 | Disposition: A | Discharge status: Home / Self Care | Payer: Medicaid Other | Attending: Obstetrics and Gynecology | Admitting: Obstetrics and Gynecology

## 2019-02-24 ENCOUNTER — Other Ambulatory Visit: Payer: Self-pay

## 2019-02-24 DIAGNOSIS — Z6790 Unspecified blood type, Rh positive: Secondary | ICD-10-CM

## 2019-02-24 DIAGNOSIS — O4691 Antepartum hemorrhage, unspecified, first trimester: Secondary | ICD-10-CM | POA: Diagnosis not present

## 2019-02-24 DIAGNOSIS — O26899 Other specified pregnancy related conditions, unspecified trimester: Secondary | ICD-10-CM

## 2019-02-24 DIAGNOSIS — O208 Other hemorrhage in early pregnancy: Secondary | ICD-10-CM | POA: Insufficient documentation

## 2019-02-24 DIAGNOSIS — O418X10 Other specified disorders of amniotic fluid and membranes, first trimester, not applicable or unspecified: Secondary | ICD-10-CM

## 2019-02-24 DIAGNOSIS — O4690 Antepartum hemorrhage, unspecified, unspecified trimester: Secondary | ICD-10-CM

## 2019-02-24 DIAGNOSIS — R102 Pelvic and perineal pain: Secondary | ICD-10-CM | POA: Diagnosis not present

## 2019-02-24 DIAGNOSIS — Z3A09 9 weeks gestation of pregnancy: Secondary | ICD-10-CM | POA: Insufficient documentation

## 2019-02-24 DIAGNOSIS — Z3A01 Less than 8 weeks gestation of pregnancy: Secondary | ICD-10-CM

## 2019-02-24 DIAGNOSIS — O3411 Maternal care for benign tumor of corpus uteri, first trimester: Secondary | ICD-10-CM | POA: Insufficient documentation

## 2019-02-24 DIAGNOSIS — D252 Subserosal leiomyoma of uterus: Secondary | ICD-10-CM | POA: Insufficient documentation

## 2019-02-24 DIAGNOSIS — O26891 Other specified pregnancy related conditions, first trimester: Secondary | ICD-10-CM | POA: Diagnosis not present

## 2019-02-24 DIAGNOSIS — Z3490 Encounter for supervision of normal pregnancy, unspecified, unspecified trimester: Secondary | ICD-10-CM

## 2019-02-24 DIAGNOSIS — O468X1 Other antepartum hemorrhage, first trimester: Secondary | ICD-10-CM

## 2019-02-24 LAB — WET PREP, GENITAL
Clue Cells Wet Prep HPF POC: NONE SEEN
Sperm: NONE SEEN
Trich, Wet Prep: NONE SEEN
Yeast Wet Prep HPF POC: NONE SEEN

## 2019-02-24 LAB — CBC
HCT: 40.7 % (ref 36.0–46.0)
Hemoglobin: 13.2 g/dL (ref 12.0–15.0)
MCH: 27 pg (ref 26.0–34.0)
MCHC: 32.4 g/dL (ref 30.0–36.0)
MCV: 83.4 fL (ref 80.0–100.0)
Platelets: 255 10*3/uL (ref 150–400)
RBC: 4.88 MIL/uL (ref 3.87–5.11)
RDW: 14.6 % (ref 11.5–15.5)
WBC: 6.3 10*3/uL (ref 4.0–10.5)
nRBC: 0 % (ref 0.0–0.2)

## 2019-02-24 LAB — URINALYSIS, ROUTINE W REFLEX MICROSCOPIC
Bilirubin Urine: NEGATIVE
Glucose, UA: NEGATIVE mg/dL
Hgb urine dipstick: NEGATIVE
Ketones, ur: NEGATIVE mg/dL
Leukocytes,Ua: NEGATIVE
Nitrite: NEGATIVE
Protein, ur: NEGATIVE mg/dL
Specific Gravity, Urine: 1.004 — ABNORMAL LOW (ref 1.005–1.030)
pH: 6 (ref 5.0–8.0)

## 2019-02-24 LAB — HCG, QUANTITATIVE, PREGNANCY: hCG, Beta Chain, Quant, S: 63869 m[IU]/mL — ABNORMAL HIGH (ref <5)

## 2019-02-24 NOTE — Discharge Instructions (Signed)
Dolor abdominal durante el embarazo Abdominal Pain During Pregnancy  El dolor abdominal es comn durante el embarazo y tiene muchas causas posibles. Algunas causas son ms graves que otras, y a Curator causa se desconoce. El dolor abdominal puede ser un indicio de que est comenzando el [place]. Tambin puede ser ocasionado por el crecimiento y estiramiento de los msculos y ligamentos durante el Media planner. Siempre informe a su mdico si siente dolor abdominal. Siga estas indicaciones en su casa:  No tenga relaciones sexuales ni se coloque nada dentro de la vagina hasta que el dolor haya desaparecido completamente.  Descanse todo lo que pueda [employer] dolor se le haya calmado.  Beba suficiente lquido para Consulting civil engineer orina de color amarillo plido.  Tome los medicamentos de venta libre y los recetados solamente como se lo haya indicado el mdico.  Consulting civil engineer a todas las visitas de control como se lo haya indicado el mdico. Esto es importante. Comunquese con un mdico si:  El dolor contina o empeora despus de Production assistant, radio.  Siente dolor en la parte inferior del abdomen que: ? Va y viene en intervalos regulares. ? Se extiende a la espalda. ? Es parecido a los Stage manager.  Siente dolor o ardor al Garment/textile technologist. Solicite ayuda de inmediato si:  Tiene fiebre o siente escalofros.  Tiene una hemorragia vaginal abundante.  Tiene una prdida de lquido por la vagina.  Elimina tejidos por la vagina.  Vomita o tiene diarrea durante ms de 24horas.  El beb se mueve menos de lo habitual.  Se siente dbil o se desmaya.  Le falta el aire.  Siente dolor intenso en la parte superior del abdomen. Resumen  El dolor abdominal es comn durante el [place] y tiene muchas causas posibles.  Si siente dolor abdominal durante el embarazo, informe al mdico de inmediato.  Siga las indicaciones del mdico para el cuidado en el hogar y concurra a todas las visitas de control como se lo  hayan indicado. Esta informacin no tiene Marine scientist el consejo del mdico. Asegrese de hacerle al mdico cualquier pregunta que tenga. Document Released: 08/15/2005 Document Revised: 02/07/2017 Document Reviewed: 02/07/2017 Elsevier Patient Education  [place] vaginal durante el primer trimestre de Media planner Vaginal Bleeding During Pregnancy, First Trimester  Durante los primeros meses del embarazo es relativamente frecuente que se presente una pequea hemorragia (prdidas) proveniente de la vagina. Normalmente esto se detiene por s solo. Estas hemorragias o prdidas tienen diversas causas al inicio del embarazo. Algunas pueden estar relacionadas al [employer] y otras no. En muchos casos, la hemorragia es normal y no es un problema. Sin embargo, la hemorragia tambin puede ser un signo de algo grave. Debe informar a su mdico de inmediato si tiene alguna hemorragia vaginal. Algunas causas posibles de hemorragia vaginal durante el primer trimestre incluyen lo siguiente:  Infeccin o inflamacin del cuello uterino.  Crecimientos anormales (plipos) en el cuello uterino.  Aborto espontneo o amenaza de aborto espontneo.  Tejido fetal que se desarrolla fuera del tero (embarazo ectpico).  Una masa de tejido que crece en el tero debido a una mala fertilizacin del vulo (embarazo molar). Siga estas indicaciones en su casa: Actividad  Siga las indicaciones del mdico respecto de las restricciones en las actividades. Pregunte qu actividades son seguras para usted.  Si es necesario, organcese para que alguien la ayude con las actividades cotidianas.  No tenga relaciones sexuales ni orgasmos hasta que su mdico le diga que es seguro. Instrucciones generales  Tome los medicamentos de venta libre y los recetados solamente como se lo haya indicado el mdico.  Est atenta a cualquier cambio en los sntomas.  No use tampones ni se haga duchas vaginales.  Lleve  un registro de la cantidad de apsitos higinicos que [place] por da, la frecuencia con la que los cambia y cun empapados (saturados) estn.  Si elimina tejido por la vagina, gurdelo para mostrrselo al [employer].  Concurra a todas las visitas de control como se lo haya indicado el mdico. Esto es importante. Comunquese con un mdico si:  Tiene un sangrado vaginal en cualquier momento del embarazo.  Tiene calambres o dolores de [place].  Tiene fiebre. Solicite ayuda de inmediato si:  Tiene clicos intensos en la espalda o el abdomen.  Elimina cogulos grandes o gran cantidad de tejido por la vagina.  La hemorragia aumenta.  Se siente mareada, dbil, o se desmaya.  Tiene escalofros.  Tiene una prdida importante o sale lquido a borbotones por la vagina. Resumen  Durante los primeros meses del embarazo es relativamente frecuente que se presente una pequea hemorragia (prdidas) proveniente de la vagina.  Estas hemorragias o prdidas tienen diversas causas al inicio del embarazo.  Debe informar a su mdico de inmediato si tiene alguna hemorragia vaginal. Esta informacin no tiene como fin reemplazar el consejo del mdico. Asegrese de hacerle al mdico cualquier pregunta que tenga. Document Released: 05/25/2005 Document Revised: 05/11/2017 Document Reviewed: 05/11/2017 Elsevier Patient Education  [place]. Hematoma subcorinico Subchorionic Hematoma  Un hematoma subcorinico es una acumulacin de sangre entre la pared externa del embrin (corion) y la pared interna de la matriz (tero). Esta afeccin puede causar hemorragia vaginal. Si causan poca o nada de hemorragia vaginal, generalmente, los hematomas pequeos que ocurren al principio del Media planner se reducen por su propia cuenta y no afectan al beb ni al [employer]. Cuando la hemorragia comienza ms tarde en el embarazo, o el hematoma es ms grande o se produce en una paciente de edad avanzada, la afeccin puede ser ms  grave. Los hematomas ms grandes pueden agrandarse an ms, lo que [employer] de aborto espontneo. Esta afeccin tambin [employer] siguientes riesgos:  Separacin prematura de la placenta del tero.  Parto antes de trmino Public affairs consultant).  Muerte fetal. Cules son las causas? Se desconoce la causa exacta de esta afeccin. Ocurre cuando la sangre queda atrapada entre la placenta y la pared uterina porque la placenta se ha separado del Environmental consultant original del implante. Qu incrementa el riesgo? Es ms probable que desarrolle esta afeccin si:  Recibi tratamiento con medicamentos para la fertilidad.  La concepcin se realiz a travs de la fertilizacin in vitro (FIV). Cules son los signos o los sntomas? Los sntomas de esta afeccin [employer] siguientes:  Prdida o hemorragia vaginal.  Contracciones del tero. Estas contracciones provocan dolor abdominal. En ocasiones, puede no haber sntomas y Engineer, technical sales solo se puede ver cuando se toman imgenes ecogrficas (ecografa transvaginal). Cmo se diagnostica? Esta afeccin se diagnostica con un examen fsico. Es un examen plvico. Tambin pueden hacerle otros estudios, por ejemplo:  Anlisis de [place].  Anlisis de [place].  Ecografa del abdomen. Cmo se trata? El tratamiento de esta afeccin puede variar. El tratamiento puede incluir lo siguiente:  Observacin cautelosa. La observarn atentamente para detectar cualquier cambio en la hemorragia. Durante esta etapa: ? El hematoma puede reabsorberse en el cuerpo. ? El hematoma puede separar el espacio lleno de lquido que contiene al embrin (saco gestacional)  de la pared del tero (endometrio).  Medicamentos.  Restriccin de [employer]. Puede ser necesaria hasta que se detenga la hemorragia. Siga estas indicaciones en su casa:  Haga reposo en cama si se lo indica el mdico.  No levante ningn objeto que pese ms de 10libras (4,5kg) o siga las  indicaciones del mdico.  No consuma ningn producto que contenga nicotina o tabaco, como cigarrillos y Psychologist, sport and exercise. Si necesita ayuda para dejar de fumar, consulte al mdico.  Lleve un registro escrito de la cantidad de toallas higinicas que utiliza cada da y cun empapadas (saturadas) estn.  No use tampones.  Concurra a todas las visitas de control como se lo haya indicado el mdico. Esto es importante. El profesional podr pedirle que se realice anlisis de seguimiento, ecografas o [place]. Comunquese con un mdico si:  Tiene una hemorragia vaginal.  Tiene fiebre. Solicite ayuda de inmediato si:  Siente calambres intensos en el estmago, en la espalda, en el abdomen o en la pelvis.  Elimina cogulos o tejidos grandes. Guarde los tejidos para que su mdico los vea.  Tiene ms hemorragia vaginal, y se desmaya o se siente mareada o dbil. Resumen  Un hematoma subcorinico es una acumulacin de sangre entre la pared externa de la placenta y [place].  Esta afeccin puede causar hemorragia vaginal.  En ocasiones, puede no haber sntomas y Engineer, technical sales solo se puede ver cuando se toman imgenes ecogrficas.  El tratamiento puede incluir una observacin cautelosa, medicamentos o restriccin de [place]. Esta informacin no tiene Marine scientist el consejo del mdico. Asegrese de hacerle al mdico cualquier pregunta que tenga. Document Released: 12/01/2008 Document Revised: 05/25/2017 Document Reviewed: 05/25/2017 Elsevier Patient Education  2020 [employer].

## 2019-02-24 NOTE — MAU Note (Signed)
Have a little pain in lower abd.  Had some bleeding last night. Pt is preg.  Has proof of preg from [facility] on 6/12. Stomach always feels bloated.

## 2019-02-24 NOTE — MAU Provider Note (Signed)
History     CSN: [id number]  Arrival date and time: 02/24/19 7824   First Provider Initiated Contact with Patient 02/24/19 1020      Chief Complaint  Patient presents with  . Vaginal Bleeding  . Abdominal Pain   [name] is a 43 y.o. [phone] at [redacted]w[redacted]d who presents to MAU for vaginal bleeding and pelvic pain which began 2 weeks ago. Pt reports both the pain and bleeding have been intermittent, and the bleeding only occurs once or twice per week. Pt reports the bleeding is only a little bit of spotting when she wipes.  Passing blood clots? no Blood soaking clothes? no Lightheaded/dizzy? no Significant pelvic pain or cramping? no, bilateral, 5/10, pt does not appear distressed, able to ambulate normally, no guarding, carrying on a conversation normally Passed any tissue? no Hx ectopic pregnancy? yes - 7years ago, ruptured, unsure if she still has the tube, needed surgery  Current pregnancy problems? Has not yet been seen Blood Type? O Positive Allergies? NKDA Current medications? PNVs Current P[facility] & next appt? [facility], needs to return forms prior to scheduling first appt, but confirms will be seen here for care  Pt denies vaginal discharge/odor/itching. Pt denies N/V, abdominal pain, constipation, diarrhea, or urinary problems. Pt denies fever, chills, fatigue, sweating or changes in appetite. Pt denies SOB or chest pain. Pt denies dizziness, HA, light-headedness, weakness.   OB History    Gravida  6   Para  3   Term  3   Preterm      AB  2   Living  3     SAB  1   TAB  0   Ectopic  1   Multiple  0   Live Births  3           Past Medical History:  Diagnosis Date  . AMA (advanced maternal age) multigravida 67+   . No pertinent past medical history     Past Surgical History:  Procedure Laterality Date  . LAPAROSCOPY  04/26/2012   Procedure: LAPAROSCOPY OPERATIVE;  Surgeon: Mora Bellman, MD;  Location: [facility];  Service: Gynecology;   Laterality: N/A;  Left Salpingectomy  . NO PAST SURGERIES      Family History  Problem Relation Age of Onset  . Hypertension Father   . Diabetes Maternal Aunt   . Diabetes Paternal Grandfather     Social History   Tobacco Use  . Smoking status: Never Smoker  . Smokeless tobacco: Never Used  Substance Use Topics  . Alcohol use: Yes    Comment: occassionally  . Drug use: No    Allergies: No Known Allergies  Medications Prior to Admission  Medication Sig Dispense Refill Last Dose  . Prenatal Multivit-Min-Fe-FA (PRENATAL VITAMINS) 0.8 MG tablet Take 1 tablet by mouth daily. (Patient not taking: Reported on 09/07/2017) 30 tablet 12     Review of Systems  Constitutional: Negative for chills, diaphoresis, fatigue and fever.  Respiratory: Negative for shortness of breath.   Cardiovascular: Negative for chest pain.  Gastrointestinal: Negative for abdominal pain, constipation, diarrhea, nausea and vomiting.  Genitourinary: Positive for pelvic pain and vaginal bleeding. Negative for dysuria, flank pain, frequency, urgency and vaginal discharge.  Neurological: Negative for dizziness, weakness, light-headedness and headaches.   Physical Exam   Blood pressure 120/81, pulse 79, temperature 97.9 F (36.6 C), temperature source Oral, resp. rate 16, weight 53.9 kg, last menstrual period 12/19/2018, SpO2 100 %, unknown if currently breastfeeding.  Patient Vitals  for the past 24 hrs:  BP Temp Temp src Pulse Resp SpO2 Weight  02/24/19 1019 120/81 - - 79 - - -  02/24/19 1003 119/75 97.9 F (36.6 C) Oral 75 16 100 % 53.9 kg   Physical Exam  Constitutional: She is oriented to person, place, and time. She appears well-developed and well-nourished. No distress.  HENT:  Head: Normocephalic and atraumatic.  Respiratory: Effort normal.  GI: Soft. She exhibits no distension and no mass. There is no abdominal tenderness. There is no rebound and no guarding.  Genitourinary: There is no rash,  tenderness or lesion on the right labia. There is no rash, tenderness or lesion on the left labia. Uterus is not tender. Cervix exhibits no motion tenderness, no discharge and no friability. Right adnexum displays no mass, no tenderness and no fullness. Left adnexum displays no mass, no tenderness and no fullness.    No vaginal discharge, tenderness or bleeding.  No tenderness or bleeding in the vagina.  Neurological: She is alert and oriented to person, place, and time.  Skin: Skin is warm and dry. She is not diaphoretic.  Psychiatric: She has a normal mood and affect. Her behavior is normal. Judgment and thought content normal.   Results for orders placed or performed during the hospital encounter of 02/24/19 (from the past 24 hour(s))  Urinalysis, Routine w reflex microscopic     Status: Abnormal   Collection Time: 02/24/19 10:08 AM  Result Value Ref Range   Color, Urine STRAW (A) YELLOW   APPearance CLEAR CLEAR   Specific Gravity, Urine 1.004 (L) 1.005 - 1.030   pH 6.0 5.0 - 8.0   Glucose, UA NEGATIVE NEGATIVE mg/dL   Hgb urine dipstick NEGATIVE NEGATIVE   Bilirubin Urine NEGATIVE NEGATIVE   Ketones, ur NEGATIVE NEGATIVE mg/dL   Protein, ur NEGATIVE NEGATIVE mg/dL   Nitrite NEGATIVE NEGATIVE   Leukocytes,Ua NEGATIVE NEGATIVE  Wet prep, genital     Status: Abnormal   Collection Time: 02/24/19 10:33 AM  Result Value Ref Range   Yeast Wet Prep HPF POC NONE SEEN NONE SEEN   Trich, Wet Prep NONE SEEN NONE SEEN   Clue Cells Wet Prep HPF POC NONE SEEN NONE SEEN   WBC, Wet Prep HPF POC MODERATE (A) NONE SEEN   Sperm NONE SEEN   CBC     Status: None   Collection Time: 02/24/19 10:42 AM  Result Value Ref Range   WBC 6.3 4.0 - 10.5 K/uL   RBC 4.88 3.87 - 5.11 MIL/uL   Hemoglobin 13.2 12.0 - 15.0 g/dL   HCT 40.7 36.0 - 46.0 %   MCV 83.4 80.0 - 100.0 fL   MCH 27.0 26.0 - 34.0 pg   MCHC 32.4 30.0 - 36.0 g/dL   RDW 14.6 11.5 - 15.5 %   Platelets 255 150 - 400 K/uL   nRBC 0.0 0.0 - 0.2 %   hCG, quantitative, pregnancy     Status: Abnormal   Collection Time: 02/24/19 10:42 AM  Result Value Ref Range   hCG, Beta Chain, Quant, S 63,869 (H) <5 mIU/mL   US Ob Comp Less 14 Wks  Result Date: 02/24/2019 CLINICAL DATA:  Vaginal bleeding, pain EXAM: OBSTETRIC <14 WK ULTRASOUND TECHNIQUE: Transabdominal ultrasound was performed for evaluation of the gestation as well as the maternal uterus and adnexal regions. COMPARISON:  None. FINDINGS: Intrauterine gestational sac: Single Yolk sac:  Visualized. Embryo:  Visualized. Cardiac Activity: Visualized. Heart Rate: 140 bpm CRL:   9.4 mm  6 w 6 d                  [place] EDC: 10/14/2019 Subchorionic hemorrhage: Small, measuring approximately 1.8 cm in length Maternal uterus/adnexae: Subserosal fibroid of the left posterior uterine body measuring 1.7 x 1.4 x 1.3 cm. IMPRESSION: 1. Single intrauterine gestation at sonographic gestational age of [redacted] weeks, 6 days. EDC 10/14/2019. 2.  Small subchorionic hemorrhage.  Attention on follow-up. Electronically Signed   By: Eddie Candle M.D.   On: 02/24/2019 12:49    MAU Course  Procedures  MDM -r/o ectopic -pt had left salpingectomy 03/2012 -UA: straw/SG 1.004, otherwise WNL -CBC: WNL -[place]: single IUP, [redacted]w[redacted]d, sm subchorionic hemorrhage, sm subserosal fibroid -hCG: 52,778 -ABO: O Positive -WetPrep: moderate WBCs, otherwise WNL -GC/CT collected -pt discharged to home in stable condition  Orders Placed This Encounter  Procedures  . Wet prep, genital    Standing Status:   Standing    Number of Occurrences:   1  . US OB Comp Less 14 Wks    Standing Status:   Standing    Number of Occurrences:   1    Order Specific Question:   Symptom/Reason for Exam    Answer:   Vaginal bleeding in pregnancy [242353]    Order Specific Question:   Symptom/Reason for Exam    Answer:   Pelvic pain in pregnancy [[id number]]  . Urinalysis, Routine w reflex microscopic    Standing Status:   Standing    Number of Occurrences:    1  . CBC    Standing Status:   Standing    Number of Occurrences:   1  . hCG, quantitative, pregnancy    Standing Status:   Standing    Number of Occurrences:   1  . Discharge patient    Order Specific Question:   Discharge disposition    Answer:   01-Home or Self Care [1]    Order Specific Question:   Discharge patient date    Answer:   02/24/2019   No orders of the defined types were placed in this encounter.  Assessment and Plan   1. Vaginal bleeding in pregnancy   2. Pelvic pain in pregnancy   3. Intrauterine pregnancy   4. Subchorionic hemorrhage of placenta in first trimester, single or unspecified fetus   5. Blood type, Rh positive   6. [redacted] weeks gestation of pregnancy    Allergies as of 02/24/2019   No Known Allergies     Medication List    TAKE these medications   Prenatal Vitamins 0.8 MG tablet Take 1 tablet by mouth daily.      -will call with culture results, if positive -SAB/pain/return MAU precautions given -pt advised to schedule NOB appt ASAP with [facility] -pt discharged to home in stable condition  Elmyra Ricks E Destinie Thornsberry 02/24/2019, 1:13 PM

## 2019-02-26 LAB — GC/CHLAMYDIA PROBE AMP (~~LOC~~) NOT AT ARMC
Chlamydia: NEGATIVE
Neisseria Gonorrhea: NEGATIVE

## 2019-03-14 ENCOUNTER — Ambulatory Visit (HOSPITAL_COMMUNITY): Payer: No Typology Code available for payment source

## 2019-03-18 LAB — OB RESULTS CONSOLE RPR: RPR: NONREACTIVE

## 2019-03-18 LAB — OB RESULTS CONSOLE GC/CHLAMYDIA: Gonorrhea: NEGATIVE

## 2019-03-18 LAB — OB RESULTS CONSOLE HIV ANTIBODY (ROUTINE TESTING): HIV: NONREACTIVE

## 2019-03-18 LAB — OB RESULTS CONSOLE HEPATITIS B SURFACE ANTIGEN: Hepatitis B Surface Ag: NEGATIVE

## 2019-03-18 LAB — OB RESULTS CONSOLE RUBELLA ANTIBODY, IGM: Rubella: IMMUNE

## 2019-03-21 ENCOUNTER — Other Ambulatory Visit (HOSPITAL_COMMUNITY): Payer: Self-pay | Admitting: Nurse Practitioner

## 2019-03-21 DIAGNOSIS — Z3682 Encounter for antenatal screening for nuchal translucency: Secondary | ICD-10-CM

## 2019-03-21 DIAGNOSIS — Z3A13 13 weeks gestation of pregnancy: Secondary | ICD-10-CM

## 2019-04-08 ENCOUNTER — Ambulatory Visit (HOSPITAL_COMMUNITY)
Admission: RE | Admit: 2019-04-08 | Discharge: 2019-04-08 | Disposition: A | Discharge status: Home / Self Care | Payer: Medicaid Other | Source: Ambulatory Visit | Attending: Obstetrics and Gynecology | Admitting: Obstetrics and Gynecology

## 2019-04-08 ENCOUNTER — Other Ambulatory Visit: Payer: Self-pay

## 2019-04-08 ENCOUNTER — Ambulatory Visit (HOSPITAL_COMMUNITY): Payer: Medicaid Other | Admitting: *Deleted

## 2019-04-08 ENCOUNTER — Encounter (HOSPITAL_COMMUNITY): Payer: Self-pay | Admitting: *Deleted

## 2019-04-08 ENCOUNTER — Ambulatory Visit (HOSPITAL_COMMUNITY): Payer: Medicaid Other

## 2019-04-08 VITALS — BP 105/67 | HR 81 | Temp 98.7°F | Wt 123.0 lb

## 2019-04-08 DIAGNOSIS — Z3A13 13 weeks gestation of pregnancy: Secondary | ICD-10-CM | POA: Diagnosis not present

## 2019-04-08 DIAGNOSIS — Z3682 Encounter for antenatal screening for nuchal translucency: Secondary | ICD-10-CM | POA: Insufficient documentation

## 2019-04-08 DIAGNOSIS — O09511 Supervision of elderly primigravida, first trimester: Secondary | ICD-10-CM

## 2019-04-08 DIAGNOSIS — O09521 Supervision of elderly multigravida, first trimester: Secondary | ICD-10-CM | POA: Diagnosis not present

## 2019-04-08 DIAGNOSIS — O09529 Supervision of elderly multigravida, unspecified trimester: Secondary | ICD-10-CM

## 2019-04-08 DIAGNOSIS — Z1379 Encounter for other screening for genetic and chromosomal anomalies: Secondary | ICD-10-CM

## 2019-04-08 DIAGNOSIS — Z369 Encounter for antenatal screening, unspecified: Secondary | ICD-10-CM

## 2019-04-13 LAB — MATERNIT 21 PLUS CORE, BLOOD
Fetal Fraction: 9
Result (T21): NEGATIVE
Trisomy 13 (Patau syndrome): NEGATIVE
Trisomy 18 (Edwards syndrome): NEGATIVE
Trisomy 21 (Down syndrome): NEGATIVE

## 2019-04-17 ENCOUNTER — Telehealth (HOSPITAL_COMMUNITY): Payer: Self-pay | Admitting: *Deleted

## 2019-04-17 NOTE — Telephone Encounter (Signed)
Calling with lab results, message left for patient to return call.  Spanish interpreter utilized.

## 2019-04-18 ENCOUNTER — Telehealth (HOSPITAL_COMMUNITY): Payer: Self-pay | Admitting: *Deleted

## 2019-04-18 NOTE — Telephone Encounter (Signed)
Patient returned call to RN.  Name and DOB verified.  Negative MaterniT21 result given.  Pt glad to hear the good news.

## 2019-05-20 ENCOUNTER — Other Ambulatory Visit: Payer: Self-pay

## 2019-05-20 ENCOUNTER — Inpatient Hospital Stay (HOSPITAL_COMMUNITY)
Admission: AD | Admit: 2019-05-20 | Discharge: 2019-05-21 | DRG: 779 | Disposition: A | Discharge status: Home / Self Care | Payer: Medicaid Other | Attending: Obstetrics and Gynecology | Admitting: Obstetrics and Gynecology

## 2019-05-20 ENCOUNTER — Encounter (HOSPITAL_COMMUNITY): Payer: Self-pay

## 2019-05-20 ENCOUNTER — Inpatient Hospital Stay (HOSPITAL_COMMUNITY): Payer: Medicaid Other

## 2019-05-20 DIAGNOSIS — Z20828 Contact with and (suspected) exposure to other viral communicable diseases: Secondary | ICD-10-CM | POA: Diagnosis present

## 2019-05-20 DIAGNOSIS — Z3A19 19 weeks gestation of pregnancy: Secondary | ICD-10-CM

## 2019-05-20 DIAGNOSIS — O3680X0 Pregnancy with inconclusive fetal viability, not applicable or unspecified: Secondary | ICD-10-CM | POA: Diagnosis not present

## 2019-05-20 DIAGNOSIS — O021 Missed abortion: Secondary | ICD-10-CM | POA: Diagnosis present

## 2019-05-20 LAB — URINALYSIS, ROUTINE W REFLEX MICROSCOPIC
Bilirubin Urine: NEGATIVE
Glucose, UA: NEGATIVE mg/dL
Hgb urine dipstick: NEGATIVE
Ketones, ur: 80 mg/dL — AB
Leukocytes,Ua: NEGATIVE
Nitrite: NEGATIVE
Protein, ur: NEGATIVE mg/dL
Specific Gravity, Urine: 1.011 (ref 1.005–1.030)
pH: 5 (ref 5.0–8.0)

## 2019-05-20 LAB — SARS CORONAVIRUS 2 BY RT PCR (HOSPITAL ORDER, PERFORMED IN ~~LOC~~ HOSPITAL LAB): SARS Coronavirus 2: NEGATIVE

## 2019-05-20 LAB — CBC
HCT: 45.8 % (ref 36.0–46.0)
Hemoglobin: 15.3 g/dL — ABNORMAL HIGH (ref 12.0–15.0)
MCH: 28 pg (ref 26.0–34.0)
MCHC: 33.4 g/dL (ref 30.0–36.0)
MCV: 83.7 fL (ref 80.0–100.0)
Platelets: 306 10*3/uL (ref 150–400)
RBC: 5.47 MIL/uL — ABNORMAL HIGH (ref 3.87–5.11)
RDW: 14.2 % (ref 11.5–15.5)
WBC: 9 10*3/uL (ref 4.0–10.5)
nRBC: 0 % (ref 0.0–0.2)

## 2019-05-20 LAB — TYPE AND SCREEN
ABO/RH(D): O POS
Antibody Screen: NEGATIVE

## 2019-05-20 LAB — ABO/RH: ABO/RH(D): O POS

## 2019-05-20 MED ORDER — MISOPROSTOL 200 MCG PO TABS
400.0000 ug | ORAL_TABLET | ORAL | Status: DC
  Administered 2019-05-21 (×2): 400 ug via VAGINAL
  Filled 2019-05-20 (×3): qty 2

## 2019-05-20 MED ORDER — ZOLPIDEM TARTRATE 5 MG PO TABS: 5.0000 mg | ORAL_TABLET | Freq: Every evening | ORAL | Status: DC | PRN

## 2019-05-20 MED ORDER — LACTATED RINGERS IV SOLN
INTRAVENOUS | Status: DC
  Administered 2019-05-21: 08:00:00 via INTRAVENOUS

## 2019-05-20 MED ORDER — FENTANYL CITRATE (PF) 100 MCG/2ML IJ SOLN
50.0000 ug | INTRAMUSCULAR | Status: DC | PRN
  Administered 2019-05-21 (×2): 50 ug via INTRAVENOUS
  Filled 2019-05-20 (×2): qty 2

## 2019-05-20 MED ORDER — OXYTOCIN 10 UNIT/ML IJ SOLN
40.0000 [IU] | Freq: Once | INTRAMUSCULAR | Status: DC
  Filled 2019-05-20: qty 4

## 2019-05-20 MED ORDER — PRENATAL MULTIVITAMIN CH: 1.0000 | ORAL_TABLET | Freq: Every day | ORAL | Status: DC

## 2019-05-20 MED ORDER — DOCUSATE SODIUM 100 MG PO CAPS: 100.0000 mg | ORAL_CAPSULE | Freq: Every day | ORAL | Status: DC

## 2019-05-20 MED ORDER — OXYTOCIN 40 UNITS IN NORMAL SALINE INFUSION - SIMPLE MED
2.5000 [IU]/h | INTRAVENOUS | Status: DC
  Administered 2019-05-21: 11:00:00 2.5 [IU]/h via INTRAVENOUS
  Filled 2019-05-20: qty 1000

## 2019-05-20 MED ORDER — SODIUM CHLORIDE 0.9% FLUSH
3.0000 mL | Freq: Two times a day (BID) | INTRAVENOUS | Status: DC
  Administered 2019-05-20: 3 mL via INTRAVENOUS

## 2019-05-20 MED ORDER — CALCIUM CARBONATE ANTACID 500 MG PO CHEW: 2.0000 | CHEWABLE_TABLET | ORAL | Status: DC | PRN

## 2019-05-20 MED ORDER — ACETAMINOPHEN 325 MG PO TABS
650.0000 mg | ORAL_TABLET | ORAL | Status: DC | PRN
  Administered 2019-05-21: 650 mg via ORAL
  Filled 2019-05-20: qty 2

## 2019-05-20 MED ORDER — SODIUM CHLORIDE 0.9 % IV SOLN: 250.0000 mL | INTRAVENOUS | Status: DC | PRN

## 2019-05-20 MED ORDER — SODIUM CHLORIDE 0.9% FLUSH: 3.0000 mL | INTRAVENOUS | Status: DC | PRN

## 2019-05-20 MED ORDER — INFLUENZA VAC SPLIT QUAD 0.5 ML IM SUSY
0.5000 mL | PREFILLED_SYRINGE | INTRAMUSCULAR | Status: DC
  Filled 2019-05-20: qty 0.5

## 2019-05-20 MED ORDER — MISOPROSTOL 200 MCG PO TABS
600.0000 ug | ORAL_TABLET | Freq: Once | ORAL | Status: AC
  Administered 2019-05-20: 600 ug via VAGINAL
  Filled 2019-05-20: qty 3

## 2019-05-20 MED ORDER — MISOPROSTOL 200 MCG PO TABS
800.0000 ug | ORAL_TABLET | Freq: Once | ORAL | Status: AC
  Administered 2019-05-21: 800 ug via RECTAL
  Filled 2019-05-20: qty 4

## 2019-05-20 NOTE — MAU Provider Note (Signed)
History     CSN: [id number]  Arrival date and time: 05/20/19 1630   First Provider Initiated Contact with Patient 05/20/19 1710      Chief Complaint  Patient presents with  . Decreased Fetal Movement   HPI  [name] is a 43 y.o. [id number] at [redacted]w[redacted]d who presents to MAU today from the [facility] after they were unable to obtain FHTs with doppler at a routine prenatal visit. The patient denies abdominal pain or vaginal bleeding today.   OB History    Gravida  6   Para  3   Term  3   Preterm      AB  2   Living  3     SAB  1   TAB  0   Ectopic  1   Multiple  0   Live Births  3           Past Medical History:  Diagnosis Date  . AMA (advanced maternal age) multigravida 56+   . No pertinent past medical history     Past Surgical History:  Procedure Laterality Date  . LAPAROSCOPY  04/26/2012   Procedure: LAPAROSCOPY OPERATIVE;  Surgeon: Mora Bellman, MD;  Location: [facility];  Service: Gynecology;  Laterality: N/A;  Left Salpingectomy    Family History  Problem Relation Age of Onset  . Hypertension Father   . Diabetes Maternal Aunt   . Diabetes Paternal Grandfather     Social History   Tobacco Use  . Smoking status: Never Smoker  . Smokeless tobacco: Never Used  Substance Use Topics  . Alcohol use: Not Currently    Comment: occassionally  . Drug use: No    Allergies: No Known Allergies  Medications Prior to Admission  Medication Sig Dispense Refill Last Dose  . prenatal vitamin w/FE, FA (PRENATAL 1 + 1) 27-1 MG TABS tablet Take 1 tablet by mouth daily at 12 noon.       Review of Systems  Constitutional: Negative for fever.  Gastrointestinal: Negative for abdominal pain.  Genitourinary: Negative for vaginal bleeding and vaginal discharge.   Physical Exam   Blood pressure 125/86, pulse 91, temperature 98.2 F (36.8 C), temperature source Oral, resp. rate 18, weight 55.8 kg, last menstrual period 12/19/2018, SpO2 100 %, unknown if  currently breastfeeding.  Physical Exam  Nursing note and vitals reviewed. Constitutional: She is oriented to person, place, and time. She appears well-developed and well-nourished. No distress.  HENT:  Head: Normocephalic and atraumatic.  Cardiovascular: Normal rate.  Respiratory: Effort normal.  GI: Soft. She exhibits no distension and no mass. There is no abdominal tenderness. There is no rebound and no guarding.  Neurological: She is alert and oriented to person, place, and time.  Skin: Skin is warm and dry. No erythema.  Psychiatric: She has a normal mood and affect.   Results for orders placed or performed during the hospital encounter of 05/20/19 (from the past 24 hour(s))  Urinalysis, Routine w reflex microscopic     Status: Abnormal   Collection Time: 05/20/19  5:21 PM  Result Value Ref Range   Color, Urine YELLOW YELLOW   APPearance CLEAR CLEAR   Specific Gravity, Urine 1.011 1.005 - 1.030   pH 5.0 5.0 - 8.0   Glucose, UA NEGATIVE NEGATIVE mg/dL   Hgb urine dipstick NEGATIVE NEGATIVE   Bilirubin Urine NEGATIVE NEGATIVE   Ketones, ur 80 (A) NEGATIVE mg/dL   Protein, ur NEGATIVE NEGATIVE mg/dL   Nitrite NEGATIVE NEGATIVE  Leukocytes,Ua NEGATIVE NEGATIVE     MAU Course  Procedures  Pt informed that the ultrasound is considered a limited OB ultrasound and is not intended to be a complete ultrasound exam.  Patient also informed that the ultrasound is not being completed with the intent of assessing for fetal or placental anomalies or any pelvic abnormalities.  Explained that the purpose of today's ultrasound is to assess for  viability.  Patient acknowledges the purpose of the exam and the limitations of the study. No FM noted. Unable to visualize FHR.   MDM Formal US ordered to confirm.  [place] confirms IUFD. Discussed with Dr. Rosana Hoes. Patient may be admitted for IOL or transferred to [facility] for surgical management. Options discussed with patient. Patient would prefer to stay  here for IOL. Routine antenatal admission orders entered. Dr. Rosana Hoes recommends [place] labs. [place] labs ordered.  Assessment and Plan  A: IUFD at less than [redacted] weeks GA   P: Admit to [employer], PA-C 05/20/2019, 6:40 PM

## 2019-05-20 NOTE — MAU Note (Addendum)
[facility] she has not felt baby move since yesterday. They were not able to get heart [facility] the [facility]. Pt denies pain, VB and LOF.

## 2019-05-20 NOTE — MAU Note (Signed)
covid swab collected. Pt tolerated well. Pt asymptomatic

## 2019-05-20 NOTE — H&P (Signed)
Obstetric History and Physical  Morgan Singleton is a 43 y.o. [id number] with IUP at [redacted]w[redacted]d presenting for presumed fetal demise. No heart tones at [facility] today and sent for eval. No complaints today.  Prenatal Course Source of Care: [facility] Pregnancy complications or risks: Patient Active Problem List   Diagnosis Date Noted  . IUFD at less than 20 weeks of gestation 05/20/2019  . Prolonged pregnancy 09/16/2014  . Post-term pregnancy, 40-42 weeks of gestation   . [redacted] weeks gestation of pregnancy   . Ectopic pregnancy, tubal 04/26/2012  . FIBROCYSTIC BREAST DISEASE 10/29/2009  . THYROMEGALY 06/08/2006   Prenatal labs and studies: ABO, Rh: --/--/PENDING (09/21 1843) Antibody: PENDING (09/21 1843) Rubella:   pending RPR:   negative HBsAg:   negative HIV:   non-reactive GBS:  2 hr GTT:  n/a Genetic screening normal Anatomy US n/a  Medical History:  Past Medical History:  Diagnosis Date  . AMA (advanced maternal age) multigravida 42+   . No pertinent past medical history     Past Surgical History:  Procedure Laterality Date  . LAPAROSCOPY  04/26/2012   Procedure: LAPAROSCOPY OPERATIVE;  Surgeon: Mora Bellman, MD;  Location: [facility];  Service: Gynecology;  Laterality: N/A;  Left Salpingectomy    OB History  Gravida Para Term Preterm AB Living  6 3 3   2 3   SAB TAB Ectopic Multiple Live Births  1 0 1 0 3    # Outcome Date GA Lbr Len/2nd Weight Sex Delivery Anes PTL Lv  6 Current           5 Term 09/16/14 [redacted]w[redacted]d 00:37 / 01:14 3185 g F Vag-Vacuum EPI  LIV  4 Ectopic 2013          3 Term 2008 [redacted]w[redacted]d  3[address] g [address] EPI  LIV  2 SAB 2004          1 Term 2002 [redacted]w[redacted]d  3175 g M Vag-Spont EPI  LIV    Social History   Socioeconomic History  . Marital status: Married    Spouse name: Not on file  . Number of children: Not on file  . Years of education: Not on file  . Highest education level: Not on file  Occupational History  . Not on file  Social Needs  . Financial  resource strain: Not on file  . Food insecurity    Worry: Not on file    Inability: Not on file  . Transportation needs    Medical: Not on file    Non-medical: Not on file  Tobacco Use  . Smoking status: Never Smoker  . Smokeless tobacco: Never Used  Substance and Sexual Activity  . Alcohol use: Not Currently    Comment: occassionally  . Drug use: No  . Sexual activity: Yes    Birth control/protection: None  Lifestyle  . Physical activity    Days per week: 0 days    Minutes per session: 0 min  . Stress: Not at all  Relationships  . Social connections    Talks on phone: More than three times a week    Gets together: Once a week    Attends religious service: Never    Active member of club or organization: No    Attends meetings of clubs or organizations: Never    Relationship status: Married  Other Topics Concern  . Not on file  Social History Narrative  . Not on file    Family History  Problem Relation  Age of Onset  . Hypertension Father   . Diabetes Maternal Aunt   . Diabetes Paternal Grandfather     Medications Prior to Admission  Medication Sig Dispense Refill Last Dose  . prenatal vitamin w/FE, FA (PRENATAL 1 + 1) 27-1 MG TABS tablet Take 1 tablet by mouth daily at 12 noon.       No Known Allergies  Review of Systems: Negative except for what is mentioned in HPI.  Physical Exam: BP 118/70 (BP Location: Left Arm)   Pulse 84   Temp 99.1 F (37.3 C) (Oral)   Resp 17   Wt 55.8 kg   LMP 12/19/2018 (Approximate)   SpO2 100%   BMI 24.84 kg/m  CONSTITUTIONAL: Well-developed, well-nourished female in no acute distress. Tearful but appropriate.  HENT:  Normocephalic, atraumatic, External right and left ear normal. Oropharynx is clear and moist EYES: Conjunctivae and EOM are normal. Pupils are equal, round, and reactive to light. No scleral icterus.  NECK: Normal range of motion, supple, no masses SKIN: Skin is warm and dry. No rash noted. Not diaphoretic. No  erythema. No pallor. NEUROLOGIC: Alert and oriented to person, place, and time. Normal reflexes, muscle tone coordination. No cranial nerve deficit noted. PSYCHIATRIC: Normal mood and affect. Normal behavior. Normal judgment and thought content. CARDIOVASCULAR: Normal heart rate noted, regular rhythm RESPIRATORY: Effort and breath sounds normal, no problems with respiration noted ABDOMEN: Soft, nontender, nondistended, gravid. MUSCULOSKELETAL: Normal range of motion. No edema and no tenderness. 2+ distal pulses.  Presentation: breech footling FHT:  No cardiac activity on [place] in MAU Contractions: none   Pertinent Labs/Studies:   Results for orders placed or performed during the hospital encounter of 05/20/19 (from the past 24 hour(s))  Urinalysis, Routine w reflex microscopic     Status: Abnormal   Collection Time: 05/20/19  5:21 PM  Result Value Ref Range   Color, Urine YELLOW YELLOW   APPearance CLEAR CLEAR   Specific Gravity, Urine 1.011 1.005 - 1.030   pH 5.0 5.0 - 8.0   Glucose, UA NEGATIVE NEGATIVE mg/dL   Hgb urine dipstick NEGATIVE NEGATIVE   Bilirubin Urine NEGATIVE NEGATIVE   Ketones, ur 80 (A) NEGATIVE mg/dL   Protein, ur NEGATIVE NEGATIVE mg/dL   Nitrite NEGATIVE NEGATIVE   Leukocytes,Ua NEGATIVE NEGATIVE  CBC on admission     Status: Abnormal   Collection Time: 05/20/19  6:38 PM  Result Value Ref Range   WBC 9.0 4.0 - 10.5 K/uL   RBC 5.47 (H) 3.87 - 5.11 MIL/uL   Hemoglobin 15.3 (H) 12.0 - 15.0 g/dL   HCT 45.8 36.0 - 46.0 %   MCV 83.7 80.0 - 100.0 fL   MCH 28.0 26.0 - 34.0 pg   MCHC 33.4 30.0 - 36.0 g/dL   RDW 14.2 11.5 - 15.5 %   Platelets 306 150 - 400 K/uL   nRBC 0.0 0.0 - 0.2 %  Type and screen [place]     Status: None (Preliminary result)   Collection Time: 05/20/19  6:43 PM  Result Value Ref Range   ABO/RH(D) PENDING    Antibody Screen PENDING    Sample Expiration      05/23/2019,2359 Performed at [facility], [address] [place], [place] [address]     Assessment : Morgan Singleton is a 43 y.o. [id number] at [redacted]w[redacted]d being admitted for induction of labor for fetal demise. Reviewed plan for induction, expectations, pain management. Offered genetic testing, patient declines. Will  have SW/chaplain see her tomorrow. Husband at bedside with her. She verbalizes understanding of the above, answered all questions.   Interview done via Spanish interpretor.  Induction - Admit to labor and delivery - Induction/Augmentation as ordered as per protocol - Analgesia as needed - UDS - TORCH titers pending - cytotec  Fetal  - declines genetic testing     Feliz Beam, M.D. Attending [facility] [employer] (Faculty Practice)  05/20/2019, 7:30 PM

## 2019-05-21 ENCOUNTER — Encounter (HOSPITAL_COMMUNITY): Payer: Self-pay

## 2019-05-21 DIAGNOSIS — O021 Missed abortion: Secondary | ICD-10-CM

## 2019-05-21 LAB — RAPID URINE DRUG SCREEN, HOSP PERFORMED
Amphetamines: NOT DETECTED
Barbiturates: NOT DETECTED
Benzodiazepines: NOT DETECTED
Cocaine: NOT DETECTED
Opiates: NOT DETECTED
Tetrahydrocannabinol: NOT DETECTED

## 2019-05-21 LAB — TORCH-IGM(TOXO/ RUB/ CMV/ HSV) W TITER
CMV IgM: <30 AU/mL (ref 0.0–29.9)
HSVI/II Comb IgM: <0.91 Ratio (ref 0.00–0.90)
Rubella IgM: <20 AU/mL (ref 0.0–19.9)
Toxoplasma Antibody- IgM: <3 AU/mL (ref 0.0–7.9)

## 2019-05-21 LAB — INFECT DISEASE AB IGM REFLEX 1

## 2019-05-21 MED ORDER — IBUPROFEN 800 MG PO TABS: 800.0000 mg | ORAL_TABLET | Freq: Three times a day (TID) | ORAL | 1 refills | Status: DC | PRN

## 2019-05-21 NOTE — Progress Notes (Signed)
CSW received consult due to IUFD.  CSW available for support secondary to Spiritual Care Services and will await call from Chaplain before becoming involved.  CSW screening out referral at this time.  Trellis Vanoverbeke, LCSW Clinical Social Worker [facility] Cell#: [phone]  

## 2019-05-21 NOTE — Progress Notes (Signed)
Delivery of non viable infant at 1009. Interpreter called and en route to bedside.  Dr Rip Harbour notified of delivery, awaiting delivery of placenta.  Dr. Rip Harbour en route.  Chaplain paged per patient request.    Placenta delivered at 1106am. Intact. Bleeding minimal. Pitocin 500cc bolus given.

## 2019-05-21 NOTE — Progress Notes (Signed)
Patient discharged with significant other. Discharge teaching, follow up appointments, prescriptions, and reasons to seek care discussed with interpreter present. Patient verbalized understanding.

## 2019-05-21 NOTE — Progress Notes (Signed)
Spent time throughout the day with Morgan Singleton after the delivery of her baby Morgan Singleton while she awaited the delivery of her placenta.  She shared her journey so far and her increasing awareness that something was wrong with her baby.  Morgan Singleton husband was not present for the delivery as he was home with their other children.  She has a 42 year old daughter and laughingly told me that her daughter did not want another baby, so she is hopeful it will make breaking the news easier.  Morgan Singleton was tearful as she talked about her baby.  At first, she did not want to see her baby, but did want me to give the baby a blessing.  Later, I supported Morgan Singleton as she made the decision to see her baby and made decisions about cremation, and I provided information about Morgan Singleton baptism and contacted the priest at [facility].  I was only able to leave a message and the family is aware that the priest may not come to the hospital, but the [place] sister in law did come to the hospital to bless the baby with holy water at [facility] request.    Please page as further needs arise.  Donald Prose. Elyn Peers, M.Div. [facility] Chaplain Pager [phone] Office [phone]

## 2019-05-21 NOTE — Discharge Instructions (Signed)
Parto vaginal, cuidados posteriores Vaginal Delivery, Care After [employer] brinda informacin sobre cmo cuidarse despus del [place]. Su mdico tambin podr darle indicaciones ms especficas. Comunquese con su mdico si tiene problemas o preguntas. Qu puedo esperar despus del procedimiento? Despus de un parto, es frecuente tener lo siguiente:  Media planner abdomen, la vagina y la zona entre la abertura vaginal y el ano (perineo).  Cansancio (fatiga).  [place].  Dolor en las mamas asociado a la congestin [place].  Algo de sangrado y secrecin por la vagina. Esto puede continuar durante aproximadamente 6 semanas. El sangrado vaginal y el flujo primero ser rojo y luego se convertir en rosa, Psychologist, prison and probation services, y finalmente blanco.  Molestias en la vagina o el perineo si le hicieron episiotoma o tuvo un desgarro vaginal. Esto puede persistir durante varias semanas.  Emociones que [place] rpidamente. Las emociones ms frecuentes: ? Tristeza. ? Jeanne Ivan. ? Negacin. ? Culpa. ? Depresin. Siga estas indicaciones en su casa: Cuidados de la vagina y el perineo  Mantenga el perineo limpio y [place], como se lo haya indicado el mdico.  Cuando vaya al bao, siempre higiencese de adelante hacia atrs.  Si le realizaron una episiotoma o tuvo un desgarro vaginal, contrlese para detectar signos de infeccin, como: ? Aumento del enrojecimiento, la hinchazn o el dolor en la zona perineal. ? Pus o secrecin con mal olor de la herida o de la vagina.  Para aliviar el dolor en la zona de la herida o el dolor causado por hemorroides, trate de tomar un bao de asiento tibio 2 o 3 veces por da.  No use tampones ni se haga duchas vaginales hasta que el mdico lo autorice. Medicamentos  [employer] de venta libre y los recetados solamente como se lo haya indicado el mdico.  Si le recetaron un antibitico, tmelo como se lo haya indicado el mdico. No deje de tomar el antibitico  aunque comience a sentirse mejor. Comida y bebida   Beba suficiente lquido para mantener la orina de color amarillo plido.  Coma alimentos ricos en [employer]. Estos alimentos pueden ayudarla a prevenir o Cytogeneticist. Los alimentos ricos en fibras incluyen, entre otros: ? Panes y cereales integrales. ? Arroz integral. ? Optometrist. ? Lambert Mody y verduras frescas. Actividad  Si es posible, trate de que alguien la ayude con las actividades del hogar al menos durante algunos das despus de salir del hospital.  Retome sus actividades normales como se lo haya indicado el mdico. Pregntele al mdico qu actividades son seguras para usted.  Descanse todo lo que pueda.  Hable con el mdico sobre cundo puede retomar la actividad sexual. Esto puede depender de lo siguiente: ? Riesgo de sufrir una infeccin. ? Velocidad de cicatrizacin. ? Comodidad y deseo de Financial planner actividad sexual. [place] Spates emocional  Considere buscar ayuda para superar la prdida. Algunas de las formas de apoyo que podra considerar incluyen a su lder religioso, amigos, familiares, un psicoterapeuta o un grupo de apoyo por duelo. Instrucciones generales  Use un sostn firme y [place].  Si expulsa un cogulo de sangre, gurdelo y llame al mdico para informrselo. No deseche los cogulos de sangre por el inodoro antes de recibir instrucciones del mdico antes.  Cumpla con todas las visitas de posparto programadas. En estas visitas, el mdico la controlar para asegurarse de que est sanando tanto fsica como emocionalmente. Comunquese con un mdico si:  Se siente triste o deprimida.  Tiene problemas para comer  o dormir.  Pierde el inters en actividades que solan gustarle.  Elimina un cogulo de sangre grande por la vagina.  Tiene pus o secrecin con mal olor de la herida o de la vagina.  Tiene ms enrojecimiento, hinchazn o dolor en la zona perineal.  Siente dolor o ardor al  [place].  Orina con ms frecuencia que lo normal.  Tiene fiebre.  Las [employer] se ponen rojas, se endurecen o duelen.  Se siente mareado o aturdido.  Tiene una erupcin cutnea.  Siente nuseas o vomita.  No ha tenido el perodo menstrual en las ltimas 12semanas despus del parto. Solicite ayuda de inmediato si:  Tiene dolor persistente que no se va con las medidas de alivio ni con medicamentos.  Tiene dolor en el pecho o dificultad para respirar.  Tiene visin borrosa o ve manchas.  Tiene un dolor de cabeza intenso.  Se desmaya.  Tiene un dolor repentino e intenso en la pierna.  Tiene una hemorragia tan intensa de la vagina que empapa ms de un apsito en [employer]. El sangrado no debe ser ms abundante que el perodo ms intenso que haya tenido.  Tiene pensamientos acerca de lastimarse. Si alguna vez siente que puede lastimarse a usted mismo o a Producer, television/film/video, o tiene pensamientos de poner fin a su vida, busque ayuda de inmediato. Puede dirigirse al servicio de emergencias ms cercano o comunicarse con:  El servicio de emergencias de su localidad (911 en [place]  Una lnea de asistencia al suicida y Freight forwarder en crisis, como la [employer] de Prevencin del Suicidio (National Suicide Prevention Lifeline), al [phone]. Est disponible las 24 horas del da. Resumen  [employer] de venta libre y los recetados solamente como se lo haya indicado el mdico.  Retome sus actividades normales como se lo haya indicado el mdico. Pregntele al mdico qu actividades son seguras para usted.  Considere buscar ayuda para superar la prdida. El apoyo puede encontrarlo en guas religiosos, amigos, familiares, psicoterapeutas o grupos de apoyo por duelo.  Concurra a todas las visitas de control como se lo haya indicado el mdico. Esto es importante. Esta informacin no tiene Marine scientist el consejo del mdico. Asegrese de hacerle al mdico cualquier pregunta  que tenga. Document Released: 12/30/2013 Document Revised: 11/25/2017 Document Reviewed: 10/03/2013 Elsevier Patient Education  2020 [employer].

## 2019-05-21 NOTE — Discharge Summary (Signed)
Postpartum Discharge Summary  Date of Service update     Patient Name: Morgan Singleton DOB: 28-Dec-1975 MRN: [id number]  Date of admission: 05/20/2019 Delivering Provider: This patient has no babies on file.  Date of discharge: 05/21/2019  Admitting diagnosis: DFM Intrauterine pregnancy: [redacted]w[redacted]d     Secondary diagnosis:  Active Problems:   IUFD at less than 20 weeks of gestation  Additional problems: NA     Discharge diagnosis: SVD at 19 weeks, FDIU                                                                                                Post partum procedures:NA  Augmentation: Cytotec  Complications: [facility] course: Pt was admitted with 19 week FDIU. Underwent Cytotec IOL. Delivered non viable female infant at 54. Placenta delivered at 1100.  Pt was observed for over 6 hrs after delivered. Min bleeding. Tolerating diet. Pain controlled. Voiding without problems. Felt amendable for discharge home. Discharge instructions, medications and follow up reviewed with pt.  Delivery time: This patient has no babies on file.   Physical exam  Vitals:   05/21/19 1200 05/21/19 1204 05/21/19 1206 05/21/19 1300  BP: 103/73  102/70 103/63  Pulse: 92  96 76  Resp: 18 20  18   Temp: 98.3 F (36.8 C)   98 F (36.7 C)  TempSrc: Oral   Oral  SpO2: 98%     Weight:      Height:       General: alert Lochia: appropriate Uterine Fundus: firm Incision: N/A DVT Evaluation: No evidence of DVT seen on physical exam. Labs: Lab Results  Component Value Date   WBC 9.0 05/20/2019   HGB 15.3 (H) 05/20/2019   HCT 45.8 05/20/2019   MCV 83.7 05/20/2019   PLT 306 05/20/2019   CMP Latest Ref Rng & Units 11/20/2009  Glucose 70 - 99 mg/dL 84  BUN 6 - 23 mg/dL 13  Creatinine 0.40 - 1.20 mg/dL 0.53  Sodium 135 - 145 meq/L 139  Potassium 3.5 - 5.3 meq/L 4.2  Chloride 96 - 112 meq/L 103  CO2 19 - 32 meq/L 25  Calcium 8.4 - 10.5 mg/dL 9.1  Total Protein 6.0 - 8.3 g/dL 7.3   Total Bilirubin 0.3 - 1.2 mg/dL 0.4  Alkaline Phos 39 - 117 units/L 60  AST 0 - 37 units/L 18  ALT 0 - 35 units/L 11    Discharge instruction: per After Visit Summary and "Baby and Me Booklet".  After visit meds:  Allergies as of 05/21/2019   No Known Allergies     Medication List    TAKE these medications   ibuprofen 800 MG tablet Commonly known as: ADVIL Take 1 tablet (800 mg total) by mouth 3 (three) times daily with meals as needed for headache or moderate pain.   prenatal vitamin w/FE, FA 27-1 MG Tabs tablet Take 1 tablet by mouth daily at 12 noon.       Diet: routine diet  Activity: Advance as tolerated. Pelvic rest for 6 weeks.   Outpatient follow up:2 weeks Follow up Appt:No future  appointments. Follow up Visit: [place] for [facility]. Schedule an appointment as soon as possible for a visit in 2 week(s).   Specialty: Obstetrics and Gynecology Contact information: [address] [place], [place] [id number] [place] [phone]               Newborn Data: This patient has no babies on file. FDIU   05/21/2019 Chancy Milroy, MD

## 2019-05-21 NOTE — Progress Notes (Signed)
Delivery Note  Non viable female infant delivered at 1009. 800 mcg Cytotec placed rectal. Placenta delivered intact at 1100 Min bleeding No lacerations or tears Will observe for 6 hrs and plan for discharge home with F/U in [place] office.

## 2019-05-23 LAB — SURGICAL PATHOLOGY

## 2019-06-04 ENCOUNTER — Ambulatory Visit (INDEPENDENT_AMBULATORY_CARE_PROVIDER_SITE_OTHER): Payer: Medicaid Other

## 2019-06-04 ENCOUNTER — Other Ambulatory Visit: Payer: Self-pay

## 2019-06-04 DIAGNOSIS — O021 Missed abortion: Secondary | ICD-10-CM

## 2019-06-04 DIAGNOSIS — Z30011 Encounter for initial prescription of contraceptive pills: Secondary | ICD-10-CM

## 2019-06-04 DIAGNOSIS — Z789 Other specified health status: Secondary | ICD-10-CM

## 2019-06-04 MED ORDER — LEVONORGESTREL-ETHINYL ESTRAD 0.1-20 MG-MCG PO TABS: 1.0000 | ORAL_TABLET | Freq: Every day | ORAL | 11 refills | Status: DC

## 2019-06-04 NOTE — Progress Notes (Signed)
Patient is in the office for follow up after fetal demise on 05-20-19. Pt report yesterday having a lot of bleeding and some clots, reports only a little today. [place]= 7

## 2019-06-04 NOTE — Progress Notes (Signed)
OFFICE VISIT NOTE  HistoryZareah Kleinsasser Singleton [id number] here today for postpartum visit s/p IUFD at 34 weeks. She reports that she is coping well, but tries to stay busy so she doesn't have to think about things.  Patient reports that she had some bleeding yesterday that was heavy and she passed 3 clots.  However, she endorses lots of physical activity stating she was cleaning and moving furniture.  Patient denies heavy bleeding prior to this incident or today.  Patient expresses desire for birth control and is interested in oral contraception and is considering Nexplanon.  She denies sexual activity since delivery and reports some constipation and abdominal "inflammation."  Patient denies a history of migraines, HTN, and is not a smoker.   Past Medical History:  Diagnosis Date   AMA (advanced maternal age) multigravida 35+    No pertinent past medical history     Past Surgical History:  Procedure Laterality Date   LAPAROSCOPY  04/26/2012   Procedure: LAPAROSCOPY OPERATIVE;  Surgeon: Mora Bellman, MD;  Location: [facility];  Service: Gynecology;  Laterality: N/A;  Left Salpingectomy    The following portions of the patient's history were reviewed and updated as appropriate: allergies, current medications, past family history, past medical history, past social history, past surgical history and problem list.   Health Maintenance:  Normal pap and negative HRHPV on 03/18/2019, but ECC absent. .   Review of Systems:  Pertinent items noted in HPI and remainder of comprehensive ROS otherwise negative.    Objective:    Physical Exam BP 120/75    Pulse 75    Wt 122 lb (55.3 kg)    LMP 12/19/2018 (Approximate)    Breastfeeding No    BMI 24.64 kg/m  Physical Exam Constitutional:      Appearance: Normal appearance.  HENT:     Head: Normocephalic and atraumatic.  Eyes:     Conjunctiva/sclera: Conjunctivae normal.  Neck:     Musculoskeletal: Normal range of motion.  Cardiovascular:       Rate and Rhythm: Normal rate and regular rhythm.     Heart sounds: Normal heart sounds.  Pulmonary:     Effort: Pulmonary effort is normal.     Breath sounds: Normal breath sounds.  Abdominal:     General: Bowel sounds are normal.     Palpations: Abdomen is soft.     Tenderness: There is no abdominal tenderness.  Musculoskeletal: Normal range of motion.  Skin:    General: Skin is warm and dry.  Neurological:     Mental Status: She is alert and oriented to person, place, and time.  Psychiatric:        Mood and Affect: Mood normal.        Thought Content: Thought content normal.      Labs and Imaging No results found for this or any previous visit (from the past 168 hour(s)). [place] Mfm Ob Limited  Result Date: 05/21/2019 ----------------------------------------------------------------------  OBSTETRICS REPORT                       (Signed Final 05/21/2019 03:42 pm) ---------------------------------------------------------------------- Patient Info  ID #:       [id number]                          D.O.B.:  26-Dec-1975 (42 yrs)  Name:       Morgan Singleton  Visit Date: 05/20/2019 05:46 pm              Morgan Singleton ---------------------------------------------------------------------- Performed By  Performed By:     Novella Rob        Ref. Address:     Morgan Singleton                                                             [phone]  Attending:        Sander Nephew      [facility]                    MD                                       [facility]  Referred By:      Jolaine Click NP ---------------------------------------------------------------------- Orders   #  Description                          Code         Ordered By   1  [place] MFM OB LIMITED                     [id number]     Kerry Hough  ----------------------------------------------------------------------   #  Order #                    Accession #                 Episode #   1  [id number]                  [id number]                  [id number]  ---------------------------------------------------------------------- Indications   Pregnancy with inconclusive fetal viability    O36.80X0   [redacted] weeks gestation of pregnancy                Z3A.19  ---------------------------------------------------------------------- Vital Signs  Height:        4'11" ---------------------------------------------------------------------- Fetal Evaluation  Num Of Fetuses:         1  Cardiac Activity:       Not visualized ---------------------------------------------------------------------- OB History  Gravidity:    6         Term:   3        Prem:   0        SAB:   1  TOP:          0       Ectopic:  1        Living: 3 ---------------------------------------------------------------------- Gestational Age  LMP:           21w 5d        Date:  12/19/18                 EDD:   09/25/19  Best:          Morgan Singleton 0d     Det. ByLoman Chroman         EDD:   10/14/19                                      (02/24/19) ---------------------------------------------------------------------- Impression  No cardiac activity visualized  IUFD ---------------------------------------------------------------------- Recommendations  Management per inpatient team ----------------------------------------------------------------------               Sander Nephew, MD Electronically Signed Final Report   05/21/2019 03:42 pm ----------------------------------------------------------------------    Assessment & Plan:      1. Postpartum state -Reviewed bleeding and initiation of sexual activity. -Patient encouraged to allow for necessary physical recovery time of 4-8 weeks. -Declined pelvic exam today. -Encouraged to  increase fiber in diet, hydrate, and utilize ibuprofen for pain.   2. IUFD at less than 20 weeks of gestation -Condolences given. -Offered and declined counseling support services.  3. Oral contraception initial prescription -Will send Rx for birth control pills. -Patient instructed to start at 4 weeks PP or the week of Oct 19th. -Encouraged to abstain from sexual activity until that time. -Will bring in for BP check in 6 weeks. -Discussed that she may have nexplanon inserted, if desired, on that date if she notifies in advance.   4. Language barrier -Spanish Speaking -In-person Interpretations completed by Scarlette Calico of Language Resources   Routine preventative health maintenance measures emphasized. Please refer to After Visit Summary for other counseling recommendations.   Return in about 6 weeks (around 07/16/2019) for BP Check for BCP Initiation.  Patient may desire Nexplanon at that time. Maryann Conners, CNM 06/04/2019

## 2019-06-04 NOTE — Patient Instructions (Signed)
Informacin sobre los anticonceptivos orales Oral Contraception Information Los anticonceptivos orales (ACO) son medicamentos que se utilizan para Therapist, occupational. Los ACO se toma por va oral y Designer, television/film set de las siguientes maneras:  Evitan que los ovarios liberen vulos.  Espesan la mucosidad en la parte inferior del tero (cuello uterino), lo cual impide que los espermatozoides ingresen en el tero.  Adelgazan el revestimiento del tero (endometrio), lo cual evita que un vulo fertilizado se adhiera al endometrio. Los ACO son muy efectivos cuando se toman exactamente como se indica. Sin embargo, no evitan las infeccin de transmisin sexual (ITS). La prctica del sexo seguro, como el uso de preservativos junto con un ACO, puede ayudar a prevenir las ITS. Antes de comenzar a tomar un ACO Antes de comenzar a tomar un ACO, debe hacerse un examen fsico, anlisis de sangre y Ardelia Mems prueba de Papan[place]olaou. Sin embargo, no es necesario que se haga un examen plvico para que le receten un ACO. El mdico se asegurar de que usted sea [place] buena candidata para usar anticonceptivos orales. Los ACO no son Ardelia Mems buena opcin para ciertas mujeres, incluidas las mujeres que fuman y que son Development worker, community de 71aos de edad y las mujeres con antecedentes mdicos de hipertensin arterial, trombosis venosa profunda, embolia pulmonar, accidente cerebrovascular, enfermedad cardiovascular o enfermedad vascular perifrica. Converse con su mdico acerca de los posibles efectos secundarios de los ACO que podran recetarle. Cuando comienza el uso de un ACO, tenga en cuenta que su organismo puede tardar de 2 a 3 meses en adaptarse a los [employer]. Siga las indicaciones de su mdico acerca de cmo empezar a Holiday representative del ACO. En funcin de cundo comienza a International aid/development worker, es posible que deba usar un mtodo anticonceptivo de respaldo, como preservativos, durante la primera semana. Asegrese de saber  qu hacer si alguna vez se olvida de tomar la pldora. Tipos de [employer] tipos ms comunes de anticonceptivos orales contienen estrgeno y progestina (progesterona sinttica), o solo progestina. La pldora combinada Este tipo de pldora contiene estrgeno y Agricultural engineer. Las pldoras combinadas a menudo vienen en paquetes de 21, 28 o 91 pldoras. En cada paquete, las ltimas 7pldoras pueden no contener hormonas, lo que significa que puede dejar de tomar las pldoras durante 7das. El sangrado menstrual ocurre durante la semana en que no toma las pldoras o que toma las pldoras que no contienen hormonas. La minipldora Este tipo de pldora contiene progesterona solamente. Viene en paquetes de 28 pldoras. Las 28 pldoras contienen la hormona. [place]. Es importante que las tome a la misma hora todos [place]. Ventajas de los anticonceptivos orales  Proporcionan una anticoncepcin confiable y continua si se toman segn las indicaciones.  Pueden tratar o disminuir los sntomas de lo siguiente: ? Los clicos de los perodos [place]. ? Ciclos menstruales o sangrado irregulares. ? Flujo menstrual abundante. ? Sangrado uterino anormal. ? Acn, segn el tipo de pldora. ? Sndrome ovrico poliqustico. ? Endometriosis. ? Anemia por carencia de hierro. ? Sntomas premenstruales, incluido el trastorno disfrico premenstrual.  Pueden reducir el riesgo de cncer endometrial y de ovario.  Pueden usarse como anticonceptivo de Freight forwarder.  Evitan los embarazos extrauterinos (ectpicos) y las infecciones de las trompas de [place]. Factores que pueden hacer que los anticonceptivos orales sean menos efectivos Pueden ser menos efectivos si:  Se olvida de tomar la pldora todos los das a la misma hora. Esto es especialmente  importante al tomar la minipldora.  Tiene una enfermedad estomacal o intestinal que reduce la capacidad del cuerpo para  absorber la pldora.  Toma los ACO junto con otros medicamentos que los hacen menos efectivos, como antibiticos, ciertos medicamentos para el VIH y algunos medicamentos para las convulsiones.  Toma ACO que han vencido.  Se olvida de Brewing technologist da 7 si [place] el envase de 21das. Riesgos asociados con el uso de los anticonceptivos orales Los anticonceptivos orales pueden, en [employer], causar [employer], como los siguientes:  Dolor de [place].  Depresin.  Dificultad para dormir.  Nuseas y vmitos.  Dolor a Radiographer, therapeutic.  Sangrado o manchado irregulares durante los primeros meses.  Meteorismo o retencin de lquidos.  Aumento de la presin arterial. Las pldoras combinadas tambin se asocian con un pequeo aumento en el riesgo de:  Cogulos de [place].  Infarto de miocardio.  Accidente cerebrovascular. Resumen  Los anticonceptivos orales son medicamentos que se toman por va oral para Environmental health practitioner. Son muy efectivos cuando se toman exactamente como se indica.  Los tipos ms comunes de anticonceptivos orales contienen estrgeno y progestina (progesterona sinttica), o solo progestina.  Antes de comenzar a tomar anticonceptivos orales, debe hacerse un examen fsico, anlisis de [place] y Ardelia Mems prueba de Papanicolaou. El mdico se asegurar de que usted sea [place] buena candidata para usar anticonceptivos orales.  La pldora combinada puede venir en paquetes de 21, 28 o 91das. La minipldora contiene la hormona progesterona solamente y viene en paquetes de 28pldoras.  Los anticonceptivos orales a Pension scheme manager causar [employer] Corporation, como dolor de [place], nuseas, dolor a la palpacin en las mamas o sangrado irregular. Esta informacin no tiene Marine scientist el consejo del mdico. Asegrese de hacerle al mdico cualquier pregunta que tenga. Document Released: 05/25/2005 Document Revised: 05/22/2017 Document Reviewed:  05/22/2017 Elsevier Patient Education  2020 [place] una prdida en los adultos Managing Loss, Adult Las personas vivencian las prdidas de [place] a lo largo de sus vidas. Acontecimientos como una [place], un cambio de [place] y la prdida de amigos puede provocar una sensacin de prdida. La prdida puede ser tan grave como por ejemplo un cambio importante en la salud, un divorcio, la muerte de una mascota o la muerte de un ser querido. Es muy probable que todos estos tipos de prdida causen una reaccin fsica y emocional conocida como duelo. El duelo es el resultado de un cambio grande o la ausencia de algo o alguien que era muy importante para usted. El duelo es una reaccin normal ante la prdida. Diferentes factores pueden afectar su experiencia de duelo, por ejemplo:  La naturaleza de la prdida.  La relacin que tena con la persona que perdi o con lo que perdi.  Su comprensin del duelo y cmo sobrellevarlo.  Su red de contencin. Cmo manejar los cambios en el estilo de vida Mantenga su rutina habitual tanto como sea posible.  Si tiene dificultad para concentrarse o [place] habituales, est bien que se tome un tiempo fuera de [employer] rutina.  Pase tiempo con amigos y seres queridos.  Siga una dieta saludable, duerma mucho y descanse cuando se sienta cansado. Cmo reconocer los cambios  La manera en que maneje el duelo afectar su capacidad de funcionar como lo hace normalmente. Cuando [employer], puede experimentar estos cambios:  Statistician, choque, tristeza, ansiedad, enojo, negacin y [place].  Pensamientos sobre la [place].  Llanto  repentino.  Una sensacin fsica de vaco en el estmago.  Problemas para dormir y Scientist, research (physical sciences).  Cansancio (fatiga).  Prdida de inters en actividades normales.  Soar o Advertising copywriter a la persona que muri.  Una necesidad de recordar a la persona que perdi o lo que perdi.   Dificultad para pensar en otra cosa que no sea la prdida durante un tiempo.  Alivio. Si ha estado esperando la prdida durante un [place], es probable que sienta alivio cuando suceda. Siga estas instrucciones en su casa:  Actividad Exprese sus sentimientos de [employer], como por ejemplo:  Hable con otras personas sobre su prdida. Puede ser beneficioso encontrar otras personas que hayan tenido Ray prdida similar, como un grupo de apoyo.  Anote sus sentimientos en un diario.  Realice actividad fsica para liberar el estrs y la energa emocional.  Sherilyn Cooter actividades creativas como pintura, escultura o tocar un instrumento o Conservation officer, nature.  Practique la resiliencia. Es la capacidad de reponerse y adaptarse despus de superar un obstculo. La lectura de algunas fuentes que incentiven a la resiliencia puede ser de ayuda para aprender las maneras de Paramedic. Instrucciones generales  Sea paciente con usted mismo y con los dems. Deje que el proceso de duelo transcurra y recuerde que [place]. ? Algunos tipos de duelo pueden hacer que sienta que no se termina nunca. Puede encontrar Kallie Edward de seguir adelante sin olvidar ni dejar de tener sentimientos de aprecio por aquello que perdi. ? Aceptar la prdida es un [place]. Adaptarse puede llevar meses o ms tiempo.  Concurra a todas las visitas de seguimiento como se lo haya indicado el mdico. Esto es importante. Dnde encontrar apoyo Para obtener apoyo para sobrellevar la prdida:  Pida al mdico ayuda y recomendaciones, como asesoramiento o terapia para el duelo.  Piense en la posibilidad de unirse a un grupo de apoyo para personas que estn sobrellevando una prdida. Dnde buscar ms informacin Puede encontrar ms informacin sobre cmo sobrellevar una prdida en:  Radio producer (Sociedad Estadounidense de Oncologa Clnica): www.cancer.net  American Psychological Association  (Asociacin Estadounidense de Psicologa): TVStereos.ch Comunquese con un mdico si:  El duelo es intenso y [place].  El dolor es constante y no mejora.  Es duelo comienza a Photographer su organismo, por ejemplo, se enferma.  Se siente deprimido, ansioso o solo. Solicite ayuda inmediatamente si:  Tiene pensamientos acerca de lastimarse a usted mismo o a Producer, television/film/video. Si alguna vez siente que puede lastimarse a usted mismo o a Producer, television/film/video, o tiene pensamientos de poner fin a su vida, busque ayuda de inmediato. Puede dirigirse al servicio de emergencias ms cercano o comunicarse con:  El servicio de emergencias de su localidad (911 en EE.UU.).  Una lnea de asistencia al suicida y Freight forwarder en crisis, como National Suicide Prevention Lifeline ([place]), al 910-346-4600. Est disponible las 24 horas del da. Resumen  El duelo es el resultado de un cambio grande o la ausencia de algo o alguien que era muy importante para usted. El duelo es una reaccin normal ante la prdida.  Lo profundo del duelo y el perodo de recuperacin dependen del tipo de prdida as como de la capacidad para adaptarse al cambio y Scientist, forensic los sentimientos.  Procesar el duelo requiere ser paciente y estar dispuesto a aceptar sus sentimientos, y Furniture conservator/restorer la prdida con personas que brindan contencin.  Es Patent examiner los recursos adecuados para usted y Pharmacist, community  cada persona vive el duelo de [place]. No existe un mismo proceso de duelo que suceda de la misma [place] para todas [employer].  Tenga en cuenta que cuando el duelo se torna intenso, puede ocasionar problemas ms graves [place], depresin, ansiedad o pensamientos suicidas. Hable con el mdico si tiene algunos de [employer]. Esta informacin no tiene Marine scientist el consejo del mdico. Asegrese de hacerle al mdico cualquier pregunta que tenga. Document Released:  03/16/2017 Document Revised: 12/10/2018 Document Reviewed: 03/16/2017 Elsevier Patient Education  [place].

## 2020-08-29 NOTE — L&D Delivery Note (Addendum)
OB/GYN Faculty Practice Delivery Note  Morgan Singleton is a 45 y.o. [id number] at [redacted]w[redacted]d She was admitted for SOL.   ROM: 6h 258mith clear fluid GBS Status: Neg Maximum Maternal Temperature: 98.3*F  Labor Progress: Patient presented in active labor and progressed to complete with expectant management alone.  Delivery Date/Time: 04/21/21 at 1324 Delivery: Called to room and patient was complete and pushing. Head delivered direct OA. No nuchal cord present. 40s mild dystocia resolved with McRobert's and suprapubic pressure. Infant with spontaneous cry, placed on mother's abdomen, dried and stimulated. Cord clamped x 2 after 1-minute delay, and cut by student nurse. Cord blood drawn. Placenta delivered spontaneously with gentle cord traction. Fundus firm with massage and Pitocin. Labia, perineum, vagina, and cervix inspected  with first degree perineal laceration.   Placenta: spontaneous, intact, 3VC Complications: mild shoulder dystocia Lacerations: 1st degree laceration repaired with 3-0 vicryl EBL: 308cc Analgesia: epidural  Postpartum Planning '[ ]'$  message to sent to schedule follow-up  '[ ]'$  vaccines UTD  Infant: viable female  APGARs 8,9  weight pending medical charts  CaGladys DammeMD CoMaynardPGY-3 04/21/2021, 2:00 PM   Midwife attestation: I was gloved and present for delivery in its entirety and I agree with the above resident's note.  LiFatima BlankCNM 10:20 AM

## 2020-09-08 ENCOUNTER — Ambulatory Visit: Payer: Medicaid Other | Attending: Nurse Practitioner | Admitting: Nurse Practitioner

## 2020-09-08 ENCOUNTER — Other Ambulatory Visit: Payer: Self-pay

## 2020-09-22 ENCOUNTER — Other Ambulatory Visit: Payer: Self-pay

## 2020-09-22 ENCOUNTER — Inpatient Hospital Stay (HOSPITAL_COMMUNITY)
Admission: AD | Admit: 2020-09-22 | Discharge: 2020-09-22 | Disposition: A | Discharge status: Home / Self Care | Payer: Medicaid Other | Attending: Obstetrics & Gynecology | Admitting: Obstetrics & Gynecology

## 2020-09-22 ENCOUNTER — Inpatient Hospital Stay (HOSPITAL_COMMUNITY): Payer: Medicaid Other

## 2020-09-22 ENCOUNTER — Encounter (HOSPITAL_COMMUNITY): Payer: Self-pay | Admitting: Family Medicine

## 2020-09-22 DIAGNOSIS — Z3A09 9 weeks gestation of pregnancy: Secondary | ICD-10-CM | POA: Diagnosis not present

## 2020-09-22 DIAGNOSIS — N83292 Other ovarian cyst, left side: Secondary | ICD-10-CM | POA: Diagnosis not present

## 2020-09-22 DIAGNOSIS — R109 Unspecified abdominal pain: Secondary | ICD-10-CM | POA: Diagnosis not present

## 2020-09-22 DIAGNOSIS — O26899 Other specified pregnancy related conditions, unspecified trimester: Secondary | ICD-10-CM

## 2020-09-22 DIAGNOSIS — Z3A08 8 weeks gestation of pregnancy: Secondary | ICD-10-CM | POA: Diagnosis not present

## 2020-09-22 DIAGNOSIS — O26891 Other specified pregnancy related conditions, first trimester: Secondary | ICD-10-CM | POA: Diagnosis not present

## 2020-09-22 DIAGNOSIS — Z6790 Unspecified blood type, Rh positive: Secondary | ICD-10-CM | POA: Diagnosis not present

## 2020-09-22 DIAGNOSIS — O09521 Supervision of elderly multigravida, first trimester: Secondary | ICD-10-CM | POA: Insufficient documentation

## 2020-09-22 LAB — CBC
HCT: 37.9 % (ref 36.0–46.0)
Hemoglobin: 12.5 g/dL (ref 12.0–15.0)
MCH: 27.1 pg (ref 26.0–34.0)
MCHC: 33 g/dL (ref 30.0–36.0)
MCV: 82.2 fL (ref 80.0–100.0)
Platelets: 276 10*3/uL (ref 150–400)
RBC: 4.61 MIL/uL (ref 3.87–5.11)
RDW: 15.4 % (ref 11.5–15.5)
WBC: 6.9 10*3/uL (ref 4.0–10.5)
nRBC: 0 % (ref 0.0–0.2)

## 2020-09-22 LAB — URINALYSIS, ROUTINE W REFLEX MICROSCOPIC
Bilirubin Urine: NEGATIVE
Glucose, UA: NEGATIVE mg/dL
Hgb urine dipstick: NEGATIVE
Ketones, ur: NEGATIVE mg/dL
Leukocytes,Ua: NEGATIVE
Nitrite: NEGATIVE
Protein, ur: NEGATIVE mg/dL
Specific Gravity, Urine: 1.012 (ref 1.005–1.030)
pH: 5 (ref 5.0–8.0)

## 2020-09-22 LAB — WET PREP, GENITAL
Clue Cells Wet Prep HPF POC: NONE SEEN
Sperm: NONE SEEN
Trich, Wet Prep: NONE SEEN
Yeast Wet Prep HPF POC: NONE SEEN

## 2020-09-22 LAB — HCG, QUANTITATIVE, PREGNANCY: hCG, Beta Chain, Quant, S: 189315 m[IU]/mL — ABNORMAL HIGH (ref <5)

## 2020-09-22 NOTE — MAU Note (Signed)
Presents c/o she took a pregnancy test 2 weeks ago and now she feels "strange".  States breast don't feel the same, felt harder now not feeling that way.  Reports no VB, but had spotting yesterday with wiping.  Reports minimal abdominal cramping.  LMP 07/15/2020, approximately.

## 2020-09-22 NOTE — Progress Notes (Signed)
GC/Chlamydia and wet prep cultures obtained by M. Drake Leach, CNM.

## 2020-09-22 NOTE — Discharge Instructions (Signed)
Dolor abdominal durante el embarazo Abdominal Pain During Pregnancy El dolor de vientre (abdominal) es habitual durante el embarazo. Hay muchas causas posibles. Algunas causas son ms graves que otras. Algunas veces, la causa no se conoce. Siempre comunquese con su mdico si tiene dolor abdominal. Siga estas instrucciones en su casa:  No tenga relaciones sexuales ni se coloque nada dentro de la vagina hasta que el dolor haya desaparecido completamente.  Descanse todo lo que pueda [employer] Insurance dolor se le haya calmado.  Beba suficiente lquido para Contractor pis (orina) de color amarillo plido.  Use los medicamentos de venta libre y los recetados solamente como se lo haya indicado el mdico.  Cumpla con todas las visitas de seguimiento.   Comunquese con un mdico si:  Sigue sintiendo dolor despus de descansar.  El dolor empeora despus de descansar.  Siente dolor en la parte inferior del abdomen que: ? Va y viene en intervalos regulares. ? Se extiende a la espalda. ? Se siente como clicos menstruales.  Siente dolor o ardor al hacer pis (orinar). Solicite ayuda de inmediato si:  Tiene fiebre o escalofros.  Siente que le Chief Technology Officer.  Tiene sangrado vaginal.  Tiene prdida de lquidos o tejidos por la vagina.  Vomita durante ms de 24horas.  Tiene heces lquidas (diarrea) durante ms de 24horas.  El beb se mueve menos de lo habitual.  Se siente dbil o se desmaya.  Tiene dolor muy intenso en la parte superior del abdomen. Resumen  El dolor de vientre es habitual durante el [place]. Hay muchas causas posibles.  Si tiene Media planner abdomen durante el embarazo, avise al mdico de inmediato.  Cumpla con todas las visitas de seguimiento. Esta informacin no tiene Marine scientist el consejo del mdico. Asegrese de hacerle al mdico cualquier pregunta que tenga. Document Revised: 06/04/2020 Document Reviewed: 06/04/2020 Elsevier Patient Education   2021 [place].  Vaginal Bleeding During Pregnancy, First Trimester A small amount of bleeding from the vagina is common during early pregnancy. This kind of bleeding is also called spotting. Sometimes the bleeding is normal and does not cause problems. At other times, though, bleeding may be a sign of something serious. Normal bleeding in pregnancy can happen:  When the fertilized egg attaches itself to your womb.  When blood vessels change because of the pregnancy.  When you have pelvic exams.  When you have sex. Abnormal bleeding can happen:  When you have an infection.  When you have growths in your womb. The growths are called polyps.  If you are having a miscarriage or at risk of having one.  If you have other problems in your pregnancy. Tell your doctor right away about any bleeding from your vagina. Follow these instructions at home: Watch your bleeding  Watch your condition for any changes. Let your doctor know if you are worried about something.  Try to know what causes your bleeding. Ask yourself these questions: ? Does the bleeding start on its own? ? Does the bleeding start after something is done, such as sex or a pelvic exam?  Use a diary to write the things you see about your bleeding. Write in your diary: ? If the bleeding flows freely without stopping, or if it starts and stops, and then starts again. ? If the bleeding is heavy or light. ? How many pads you use in a day and how much blood is in them.  Tell your doctor if you pass tissue. He or  she may want to see it.   Activity  Follow your doctor's instructions about how active you can be. Ask what activities are safe for you.  Do not have sex or orgasms until your doctor says that this is safe.  If needed, make plans for someone to help with your normal activities. General instructions  Take over-the-counter and prescription medicines only as told by your doctor.  Do not take aspirin because it  can cause bleeding.  Do not use tampons.  Do not douche.  Keep all follow-up visits. Contact a doctor if:  You have vaginal bleeding at any time while you are pregnant.  You have cramps.  You have a fever or chills. Get help right away if:  You have very bad cramps in your back or belly (abdomen).  You pass large clots or a lot of tissue from your vagina.  Your bleeding gets worse.  You feel light-headed.  You feel weak.  You pass out (faint).  You have chills.  You are leaking fluid from your vagina.  You have a gush of fluid from your vagina. Summary  Sometimes vaginal bleeding during pregnancy is normal and does not cause problems. At other times, bleeding may be a sign of something serious.  Tell your doctor right away about any bleeding from your vagina.  Follow your doctor's instructions about how active you can be. You may need someone to help you with your normal activities.  Keep all follow-up visits. This information is not intended to replace advice given to you by your health care provider. Make sure you discuss any questions you have with your health care provider. Document Revised: 05/07/2020 Document Reviewed: 05/07/2020 Elsevier Patient Education  2021 [employer].

## 2020-09-22 NOTE — Progress Notes (Signed)
Colman Cater, CNM into see pt and perform MSE.

## 2020-09-22 NOTE — MAU Provider Note (Signed)
History     CSN: [id number]  Arrival date and time: 09/22/20 0751   Event Date/Time   First Provider Initiated Contact with Patient 09/22/20 [phone]      Chief Complaint  Patient presents with  . Abdominal Pain   44 y.o. [id number] @[redacted]w[redacted]d  by unsure LMP presenting with cramping and spotting. Reports onset of spotting last month then noticed again last night. Cramping also started last night. Reports mild and center of lower abdomen.   OB History    Gravida  7   Para  3   Term  3   Preterm      AB  3   Living  3     SAB  2   IAB  0   Ectopic  1   Multiple  0   Live Births  3           Past Medical History:  Diagnosis Date  . AMA (advanced maternal age) multigravida 35+   . No pertinent past medical history     Past Surgical History:  Procedure Laterality Date  . LAPAROSCOPY  04/26/2012   Procedure: LAPAROSCOPY OPERATIVE;  Surgeon: 04/28/2012, MD;  Location: [facility];  Service: Gynecology;  Laterality: N/A;  Left Salpingectomy    Family History  Problem Relation Age of Onset  . Hypertension Father   . Diabetes Maternal Aunt   . Diabetes Paternal Grandfather   . Hypertension Mother     Social History   Tobacco Use  . Smoking status: Never Smoker  . Smokeless tobacco: Never Used  Vaping Use  . Vaping Use: Never used  Substance Use Topics  . Alcohol use: Not Currently    Comment: occassionally  . Drug use: No    Allergies: No Known Allergies  Medications Prior to Admission  Medication Sig Dispense Refill Last Dose  . ibuprofen (ADVIL) 800 MG tablet Take 1 tablet (800 mg total) by mouth 3 (three) times daily with meals as needed for headache or moderate pain. 30 tablet 1   . levonorgestrel-ethinyl estradiol (ALESSE) 0.1-20 MG-MCG tablet Take 1 tablet by mouth daily. 1 Package 11   . prenatal vitamin w/FE, FA (PRENATAL 1 + 1) 27-1 MG TABS tablet Take 1 tablet by mouth daily at 12 noon.       Review of Systems  Constitutional: Negative for  fever.  Gastrointestinal: Positive for abdominal pain.  Genitourinary: Positive for vaginal bleeding. Negative for dysuria, frequency, urgency and vaginal discharge.   Physical Exam   Blood pressure 110/73, pulse 70, temperature 98 F (36.7 C), temperature source Oral, resp. rate 18, weight 54.9 kg, last menstrual period 07/15/2020, SpO2 100 %.  Physical Exam Vitals and nursing note reviewed. Exam conducted with a chaperone present.  Constitutional:      General: She is not in acute distress. HENT:     Head: Normocephalic and atraumatic.  Cardiovascular:     Rate and Rhythm: Normal rate.  Pulmonary:     Effort: No respiratory distress.  Abdominal:     General: There is no distension.     Palpations: Abdomen is soft. There is no mass.     Tenderness: There is no abdominal tenderness. There is no guarding or rebound.     Hernia: No hernia is present.  Musculoskeletal:        General: Normal range of motion.     Cervical back: Normal range of motion.  Skin:    General: Skin is warm and dry.  Neurological:  General: No focal deficit present.     Mental Status: She is alert and oriented to person, place, and time.  Psychiatric:        Mood and Affect: Mood normal.    Results for orders placed or performed during the hospital encounter of 09/22/20 (from the past 24 hour(s))  Urinalysis, Routine w reflex microscopic Urine, Clean Catch     Status: None   Collection Time: 09/22/20  8:55 AM  Result Value Ref Range   Color, Urine YELLOW YELLOW   APPearance CLEAR CLEAR   Specific Gravity, Urine 1.012 1.005 - 1.030   pH 5.0 5.0 - 8.0   Glucose, UA NEGATIVE NEGATIVE mg/dL   Hgb urine dipstick NEGATIVE NEGATIVE   Bilirubin Urine NEGATIVE NEGATIVE   Ketones, ur NEGATIVE NEGATIVE mg/dL   Protein, ur NEGATIVE NEGATIVE mg/dL   Nitrite NEGATIVE NEGATIVE   Leukocytes,Ua NEGATIVE NEGATIVE  CBC     Status: None   Collection Time: 09/22/20  9:24 AM  Result Value Ref Range   WBC 6.9  4.0 - 10.5 K/uL   RBC 4.61 3.87 - 5.11 MIL/uL   Hemoglobin 12.5 12.0 - 15.0 g/dL   HCT 37.9 36.0 - 46.0 %   MCV 82.2 80.0 - 100.0 fL   MCH 27.1 26.0 - 34.0 pg   MCHC 33.0 30.0 - 36.0 g/dL   RDW 15.4 11.5 - 15.5 %   Platelets 276 150 - 400 K/uL   nRBC 0.0 0.0 - 0.2 %  Wet prep, genital     Status: Abnormal   Collection Time: 09/22/20  9:28 AM   Specimen: PATH Cytology Cervicovaginal Ancillary Only  Result Value Ref Range   Yeast Wet Prep HPF POC NONE SEEN NONE SEEN   Trich, Wet Prep NONE SEEN NONE SEEN   Clue Cells Wet Prep HPF POC NONE SEEN NONE SEEN   WBC, Wet Prep HPF POC FEW (A) NONE SEEN   Sperm NONE SEEN    US OB Comp Less 14 Wks  Result Date: 09/22/2020 CLINICAL DATA:  First trimester pregnancy, abdominal cramping for few days, LMP 07/15/2020, quantitative beta hCG pending EXAM: OBSTETRIC <14 WK ULTRASOUND TECHNIQUE: Transabdominal ultrasound was performed for evaluation of the gestation as well as the maternal uterus and adnexal regions. COMPARISON:  None FINDINGS: Intrauterine gestational sac: Present, single Yolk sac:  Present Embryo:  Present Cardiac Activity: Present Heart Rate: 166 bpm CRL:   19.4 mm   8 w 3 d                  [place] EDC: 05/01/2021 Subchorionic hemorrhage:  None visualized. Maternal uterus/adnexae: LEFT ovary measures 5.0 x 3.5 x 3.5 cm and contains a corpus luteal cyst. RIGHT ovary normal size and morphology 2.6 x 1.4 x 2.3 cm. No free pelvic fluid or adnexal masses. IMPRESSION: Single live intrauterine gestation at 8 weeks 3 days EGA by crown-rump length. Electronically Signed   By: Lavonia Dana M.D.   On: 09/22/2020 09:59   MAU Course  Procedures  MDM Labs and [place] ordered and reviewed. Viable [redacted]w[redacted]d IUP on [place], no [place], EDD changed to 05/01/21. Pt informed of results. Stable for discharge home.   Assessment and Plan   1. [redacted] weeks gestation of pregnancy   2. Abdominal cramping affecting pregnancy   3. Blood type, Rh positive    Discharge home Follow up at [facility]  to start care SAB precautions  Allergies as of 09/22/2020   No Known Allergies  Medication List    STOP taking these medications   ibuprofen 800 MG tablet Commonly known as: ADVIL   levonorgestrel-ethinyl estradiol 0.1-20 MG-MCG tablet Commonly known as: ALESSE     TAKE these medications   prenatal vitamin w/FE, FA 27-1 MG Tabs tablet Take 1 tablet by mouth daily at 12 noon.      Live interpreter present for all interactions  Julianne Handler, CNM 09/22/2020, 10:09 AM

## 2020-09-23 LAB — GC/CHLAMYDIA PROBE AMP (~~LOC~~) NOT AT ARMC
Chlamydia: NEGATIVE
Comment: NEGATIVE
Comment: NORMAL
Neisseria Gonorrhea: NEGATIVE

## 2020-10-06 DIAGNOSIS — Z3481 Encounter for supervision of other normal pregnancy, first trimester: Secondary | ICD-10-CM | POA: Diagnosis not present

## 2020-10-26 DIAGNOSIS — Z8759 Personal history of other complications of pregnancy, childbirth and the puerperium: Secondary | ICD-10-CM | POA: Diagnosis not present

## 2020-10-26 DIAGNOSIS — Z789 Other specified health status: Secondary | ICD-10-CM | POA: Diagnosis not present

## 2020-10-26 DIAGNOSIS — Z3481 Encounter for supervision of other normal pregnancy, first trimester: Secondary | ICD-10-CM | POA: Diagnosis not present

## 2020-10-26 DIAGNOSIS — O09519 Supervision of elderly primigravida, unspecified trimester: Secondary | ICD-10-CM | POA: Diagnosis not present

## 2020-10-26 DIAGNOSIS — Z9889 Other specified postprocedural states: Secondary | ICD-10-CM | POA: Diagnosis not present

## 2020-10-26 DIAGNOSIS — O0910 Supervision of pregnancy with history of ectopic or molar pregnancy, unspecified trimester: Secondary | ICD-10-CM | POA: Diagnosis not present

## 2020-11-06 ENCOUNTER — Encounter: Payer: Medicaid Other | Admitting: Obstetrics and Gynecology

## 2020-11-09 ENCOUNTER — Encounter: Payer: Self-pay | Admitting: Obstetrics and Gynecology

## 2020-11-09 ENCOUNTER — Ambulatory Visit (INDEPENDENT_AMBULATORY_CARE_PROVIDER_SITE_OTHER): Payer: Medicaid Other | Admitting: Obstetrics and Gynecology

## 2020-11-09 ENCOUNTER — Other Ambulatory Visit: Payer: Self-pay

## 2020-11-09 VITALS — BP 105/72 | HR 93 | Wt 125.7 lb

## 2020-11-09 DIAGNOSIS — Z789 Other specified health status: Secondary | ICD-10-CM

## 2020-11-09 DIAGNOSIS — O09522 Supervision of elderly multigravida, second trimester: Secondary | ICD-10-CM

## 2020-11-09 DIAGNOSIS — O0990 Supervision of high risk pregnancy, unspecified, unspecified trimester: Secondary | ICD-10-CM | POA: Diagnosis not present

## 2020-11-09 DIAGNOSIS — O09892 Supervision of other high risk pregnancies, second trimester: Secondary | ICD-10-CM | POA: Diagnosis not present

## 2020-11-09 DIAGNOSIS — Z31430 Encounter of female for testing for genetic disease carrier status for procreative management: Secondary | ICD-10-CM | POA: Diagnosis not present

## 2020-11-09 DIAGNOSIS — O021 Missed abortion: Secondary | ICD-10-CM | POA: Diagnosis not present

## 2020-11-09 DIAGNOSIS — Z136 Encounter for screening for cardiovascular disorders: Secondary | ICD-10-CM

## 2020-11-09 MED ORDER — BLOOD PRESSURE MONITORING DEVI: 1.0000 | 0 refills | Status: DC

## 2020-11-09 MED ORDER — ASPIRIN EC 81 MG PO TBEC: 81.0000 mg | DELAYED_RELEASE_TABLET | Freq: Every day | ORAL | 2 refills | Status: DC

## 2020-11-10 LAB — PROTEIN / CREATININE RATIO, URINE
Creatinine, Urine: 68.9 mg/dL
Protein, Ur: 5.5 mg/dL
Protein/Creat Ratio: 80 mg/g creat (ref 0–200)

## 2020-11-10 LAB — BETA-2 GLYCOPROTEIN I AB,G/M
Beta-2 Glyco 1 IgM: <9 GPI IgM units (ref 0–32)
Beta-2 Glyco I IgG: <9 GPI IgG units (ref 0–20)

## 2020-11-10 LAB — CARDIOLIPIN ANTIBODIES, IGM+IGG
Anticardiolipin IgG: <9 GPL U/mL (ref 0–14)
Anticardiolipin IgM: <9 MPL U/mL (ref 0–12)

## 2020-11-16 DIAGNOSIS — O09522 Supervision of elderly multigravida, second trimester: Secondary | ICD-10-CM | POA: Insufficient documentation

## 2020-11-16 NOTE — Progress Notes (Addendum)
PRENATAL VISIT NOTE  Transfer of care visit from [place] due to 04/2019 19wk IUFD  11/09/2020  Subjective:  Morgan Singleton is a 45 y.o. [id number] at [redacted]w[redacted]d being seen today for ongoing prenatal care.  She is currently monitored for the following issues for this high-risk pregnancy and has FIBROCYSTIC BREAST DISEASE; IUFD at less than 20 weeks of gestation; Supervision of high risk pregnancy, antepartum; and Language barrier on their problem list.  Patient reports no complaints.   . Vag. Bleeding: None.  Movement: Absent. Denies leaking of fluid.   The following portions of the patient's history were reviewed and updated as appropriate: allergies, current medications, past family history, past medical history, past social history, past surgical history and problem list.   Objective:   Vitals:   11/09/20 1034  BP: 105/72  Pulse: 93  Weight: 125 lb 11.2 oz (57 kg)    Fetal Status: Fetal Heart Rate (bpm): 143   Movement: Absent     General:  Alert, oriented and cooperative. Patient is in no acute distress.  Skin: Skin is warm and dry. No rash noted.   Cardiovascular: Normal heart rate noted  Respiratory: Normal respiratory effort, no problems with respiration noted  Abdomen: Soft, gravid, appropriate for gestational age.  Pain/Pressure: Absent     Pelvic: Cervical exam deferred        Extremities: Normal range of motion.  Edema: None  Mental Status: Normal mood and affect. Normal behavior. Normal judgment and thought content.   Assessment and Plan:  Pregnancy: [id number] at [redacted]w[redacted]d 1. Supervision of high risk pregnancy, antepartum Labs to be scanned in by RN. Reviewed and nothing abnormal. Patient is s/sp 9/22 SVD of IUFD at 19wks after being dx with no fetal heart tones at [facility]. The IOL and delivery were uncomplicated and she was discharged on PPD#1  Patient amenable to genetic screening, afp and starting a low dose ASA. Will get baseline pre-eclampsia, TSH labs. Early a1c neg  -  Genetic Screening - US MFM OB DETAIL +14 WK; Future - AFP, Serum, Open Spina Bifida - TSH - Comprehensive metabolic panel - Protein / creatinine ratio, urine - Lupus anticoagulant panel - Beta-2 Glycoprotein I Ab,G/M - Cardiolipin antibodies, IgM+IgG  2. Language barrier Interpreter used  3. IUFD at less than 20 weeks of gestation See above.  I told her IUFD most likely due to [facility] and genetic abnoramlities; no cytogenetics were done.  I ordered a limited thrombophilia w/u. MFM u/s also ordered  Pathology from placenta negative. TORCH labs, UDS, COVID and prior Mat21 negative on prior IUFD - [place] MFM OB DETAIL +14 WK; Future - AFP, Serum, Open Spina Bifida - TSH - Comprehensive metabolic panel - Protein / creatinine ratio, urine - Lupus anticoagulant panel - Beta-2 Glycoprotein I Ab,G/M - Cardiolipin antibodies, IgM+IgG  4. Multigravida of advanced maternal age in second trimester - Beta-2 Glycoprotein I Ab,G/M - Cardiolipin antibodies, IgM+IgG  Preterm labor symptoms and general obstetric precautions including but not limited to vaginal bleeding, contractions, leaking of fluid and fetal movement were reviewed in detail with the patient. Please refer to After Visit Summary for other counseling recommendations.   Return in about 2 weeks (around 11/23/2020) for md or app, in person.  Future Appointments  Date Time Provider [place]  11/26/2020  8:15 AM Aletha Halim, MD [facility]  11/30/2020  8:30 AM [facility] NURSE [facility]  12/08/2020  8:00 AM [facility] NURSE [facility]  12/08/2020  8:15 AM [facility] US2 [facility]  [facility]  12/10/2020  8:15 AM Griffin Basil, MD [facility]    Aletha Halim, MD

## 2020-11-26 ENCOUNTER — Other Ambulatory Visit: Payer: Self-pay

## 2020-11-26 ENCOUNTER — Ambulatory Visit (INDEPENDENT_AMBULATORY_CARE_PROVIDER_SITE_OTHER): Payer: Medicaid Other | Admitting: Obstetrics and Gynecology

## 2020-11-26 VITALS — BP 98/61 | HR 80 | Wt 128.5 lb

## 2020-11-26 DIAGNOSIS — Z3A17 17 weeks gestation of pregnancy: Secondary | ICD-10-CM

## 2020-11-26 DIAGNOSIS — O0990 Supervision of high risk pregnancy, unspecified, unspecified trimester: Secondary | ICD-10-CM

## 2020-11-26 DIAGNOSIS — O09522 Supervision of elderly multigravida, second trimester: Secondary | ICD-10-CM

## 2020-11-26 DIAGNOSIS — Z23 Encounter for immunization: Secondary | ICD-10-CM | POA: Diagnosis not present

## 2020-11-26 DIAGNOSIS — Z789 Other specified health status: Secondary | ICD-10-CM

## 2020-11-26 DIAGNOSIS — O021 Missed abortion: Secondary | ICD-10-CM

## 2020-11-26 NOTE — Progress Notes (Signed)
   PRENATAL VISIT NOTE  Subjective:  Morgan Singleton is a 45 y.o. [id number] at [redacted]w[redacted]d being seen today for ongoing prenatal care.  She is currently monitored for the following issues for this high-risk pregnancy and has FIBROCYSTIC BREAST DISEASE; IUFD at less than 20 weeks of gestation; Supervision of high risk pregnancy, antepartum; Language barrier; and Multigravida of advanced maternal age in second trimester on their problem list.  Patient reports no complaints.  Contractions: Irritability. Vag. Bleeding: None.  Movement: Present. Denies leaking of fluid.   The following portions of the patient's history were reviewed and updated as appropriate: allergies, current medications, past family history, past medical history, past social history, past surgical history and problem list.   Objective:   Vitals:   11/26/20 0824  BP: 98/61  Pulse: 80  Weight: 128 lb 8 oz (58.3 kg)    Fetal Status: Fetal Heart Rate (bpm): 140   Movement: Present     General:  Alert, oriented and cooperative. Patient is in no acute distress.  Skin: Skin is warm and dry. No rash noted.   Cardiovascular: Normal heart rate noted  Respiratory: Normal respiratory effort, no problems with respiration noted  Abdomen: Soft, gravid, appropriate for gestational age.  Pain/Pressure: Present     Pelvic: Cervical exam deferred        Extremities: Normal range of motion.  Edema: None  Mental Status: Normal mood and affect. Normal behavior. Normal judgment and thought content.   Assessment and Plan:  Pregnancy: [id number] at [redacted]w[redacted]d 1. Supervision of high risk pregnancy, antepartum Routine care. Confirms on low dose asa. Has 4/12 mfm u/s already scheduled.   2. Need for immunization against influenza Done today - Flu Vaccine QUAD 69mo+IM (Fluarix, Fluzone & Alfiuria Quad PF)  3. Multigravida of advanced maternal age in second trimester Panorama still pending  4. Language barrier Interpreter used  5. IUFD at less  than 20 weeks of gestation Neg cmp, afp, tsh, pc ratio, anti-cardiolipin and beta2 with lupus still pending per [facility] computer results. F/u these nv  4/4 visit is RN visit to check FHTs since has IUFD around that time last pregnancy.   Preterm labor symptoms and general obstetric precautions including but not limited to vaginal bleeding, contractions, leaking of fluid and fetal movement were reviewed in detail with the patient. Please refer to After Visit Summary for other counseling recommendations.   Return for md visit, in person.  Future Appointments  Date Time Provider [place]  11/30/2020  8:30 AM [facility] NURSE [facility]  12/08/2020  8:00 AM [facility] NURSE [facility]  12/08/2020  8:15 AM [facility] US2 [facility]  12/10/2020  8:15 AM Griffin Basil, MD [facility]    Aletha Halim, MD

## 2020-11-30 ENCOUNTER — Ambulatory Visit: Payer: Medicaid Other

## 2020-12-02 ENCOUNTER — Telehealth: Payer: Self-pay | Admitting: Lactation Services

## 2020-12-02 LAB — AFP, SERUM, OPEN SPINA BIFIDA
AFP MoM: 1.23
AFP Value: 42.3 ng/mL
Gest. Age on Collection Date: 15.2 weeks
Maternal Age At EDD: 43.8 yr
OSBR Risk 1 IN: 5926
Test Results:: NEGATIVE
Weight: 125 [lb_av]

## 2020-12-02 LAB — LUPUS ANTICOAG/CARDIOLIPIN AB
APTT: 26.5 s
Anticardiolipin Ab, IgG: <10 [GPL'U]
Anticardiolipin Ab, IgM: <10 [MPL'U]
Beta-2 Glycoprotein I, IgA: <10 SAU
Beta-2 Glycoprotein I, IgG: <10 SGU
Beta-2 Glycoprotein I, IgM: <10 SMU
DRVVT Screen Seconds: 26.5 s
Hexagonal Phospholipid Neutral: 0 s
INR: 0.9 ratio
Prothrombin Time: 9.6 s
Thrombin Time: 15 s

## 2020-12-02 LAB — COMPREHENSIVE METABOLIC PANEL
ALT: 12 IU/L (ref 0–32)
AST: 14 IU/L (ref 0–40)
Albumin/Globulin Ratio: 1.1 — ABNORMAL LOW (ref 1.2–2.2)
Albumin: 3.5 g/dL — ABNORMAL LOW (ref 3.8–4.8)
Alkaline Phosphatase: 82 IU/L (ref 44–121)
BUN/Creatinine Ratio: 10 (ref 9–23)
BUN: 5 mg/dL — ABNORMAL LOW (ref 6–24)
Bilirubin Total: <0.2 mg/dL (ref 0.0–1.2)
CO2: 19 mmol/L — ABNORMAL LOW (ref 20–29)
Calcium: 9.2 mg/dL (ref 8.7–10.2)
Chloride: 104 mmol/L (ref 96–106)
Creatinine, Ser: 0.48 mg/dL — ABNORMAL LOW (ref 0.57–1.00)
Globulin, Total: 3.1 g/dL (ref 1.5–4.5)
Glucose: 75 mg/dL (ref 65–99)
Potassium: 3.9 mmol/L (ref 3.5–5.2)
Sodium: 139 mmol/L (ref 134–144)
Total Protein: 6.6 g/dL (ref 6.0–8.5)
eGFR: 120 mL/min/{1.73_m2} (ref >59)

## 2020-12-02 LAB — TSH: TSH: 1.99 u[IU]/mL (ref 0.450–4.500)

## 2020-12-02 NOTE — Telephone Encounter (Signed)
Called patient with assistance of [employer], Administrator, sports.   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 Reviewed that she carries the gene, not that she carries the disease.   Advised to call Johnsie Cancel at [phone] to schedule a telephone Genetic Counseling Session. Reviewed it is recommended significant other be tested also. Advised Genetic Testing kit can be obtained in the office. Either spouse can come to next appt or kit can be given to take home.   Patient voiced understanding. Patient with no questions or concerns at this time.

## 2020-12-02 NOTE — Telephone Encounter (Signed)
Patient called and reports Morgan Singleton told her they could not talk with her about results. Patient was given results via the office and then was told to call and schedule a telephone genetic counseling. Gave her the phone number of [phone] to call again to try to set up the Genetic Counseling session.

## 2020-12-03 ENCOUNTER — Encounter: Payer: Self-pay | Admitting: Obstetrics and Gynecology

## 2020-12-03 ENCOUNTER — Encounter: Payer: Self-pay | Admitting: *Deleted

## 2020-12-03 DIAGNOSIS — D563 Thalassemia minor: Secondary | ICD-10-CM | POA: Insufficient documentation

## 2020-12-03 HISTORY — DX: Thalassemia minor: D56.3

## 2020-12-08 ENCOUNTER — Other Ambulatory Visit: Payer: Self-pay | Admitting: *Deleted

## 2020-12-08 ENCOUNTER — Ambulatory Visit: Payer: Medicaid Other | Admitting: *Deleted

## 2020-12-08 ENCOUNTER — Other Ambulatory Visit: Payer: Self-pay

## 2020-12-08 ENCOUNTER — Encounter: Payer: Self-pay | Admitting: *Deleted

## 2020-12-08 ENCOUNTER — Ambulatory Visit: Payer: Medicaid Other | Attending: Obstetrics and Gynecology

## 2020-12-08 DIAGNOSIS — O0990 Supervision of high risk pregnancy, unspecified, unspecified trimester: Secondary | ICD-10-CM | POA: Insufficient documentation

## 2020-12-08 DIAGNOSIS — O021 Missed abortion: Secondary | ICD-10-CM | POA: Insufficient documentation

## 2020-12-08 DIAGNOSIS — O09522 Supervision of elderly multigravida, second trimester: Secondary | ICD-10-CM

## 2020-12-08 NOTE — Progress Notes (Signed)
C/O "scant amount of blood on tissue when wipes."

## 2020-12-09 ENCOUNTER — Encounter: Payer: Self-pay | Admitting: Obstetrics and Gynecology

## 2020-12-09 DIAGNOSIS — O4400 Placenta previa specified as without hemorrhage, unspecified trimester: Secondary | ICD-10-CM | POA: Insufficient documentation

## 2020-12-10 ENCOUNTER — Ambulatory Visit (INDEPENDENT_AMBULATORY_CARE_PROVIDER_SITE_OTHER): Payer: Medicaid Other | Admitting: Obstetrics and Gynecology

## 2020-12-10 ENCOUNTER — Other Ambulatory Visit: Payer: Self-pay

## 2020-12-10 VITALS — BP 98/72 | HR 90 | Wt 127.2 lb

## 2020-12-10 DIAGNOSIS — Z789 Other specified health status: Secondary | ICD-10-CM

## 2020-12-10 DIAGNOSIS — O0990 Supervision of high risk pregnancy, unspecified, unspecified trimester: Secondary | ICD-10-CM

## 2020-12-10 DIAGNOSIS — O09522 Supervision of elderly multigravida, second trimester: Secondary | ICD-10-CM

## 2020-12-10 DIAGNOSIS — D563 Thalassemia minor: Secondary | ICD-10-CM

## 2020-12-10 DIAGNOSIS — Z3A19 19 weeks gestation of pregnancy: Secondary | ICD-10-CM | POA: Insufficient documentation

## 2020-12-10 DIAGNOSIS — O4402 Placenta previa specified as without hemorrhage, second trimester: Secondary | ICD-10-CM

## 2020-12-10 NOTE — Progress Notes (Signed)
Patient stated she had slight bleeding on Monday when she went to the bathroom, no more bleeding since then. Denies any ain and feel baby move everyday

## 2020-12-10 NOTE — Patient Instructions (Signed)
Talasemia Thalassemia  La talasemia es un trastorno sanguneo que causa un bajo nivel de glbulos rojos (anemia). Esta afeccin se transmite de padres a hijos a travs de genes anormales (mutaciones genticas). Las [employer] que sea difcil para el organismo producir la protena de los glbulos rojos (hemoglobina) que transporta el oxgeno desde los pulmones hacia el resto del cuerpo. Los glbulos rojos no viven mucho tiempo sin hemoglobina. La prdida de glbulos rojos causa anemia, que es el sntoma principal de talasemia. [employer] tipos principales de talasemia. El tipo depende de qu parte de la hemoglobina est afectada.  La talasemia alfa afecta la parte alfa de la hemoglobina. Esta es causada por cuatro genes. Una persona puede heredar dos genes de cada padre.  La talasemia beta afecta la parte beta de la hemoglobina. Esta es causada por dos genes. Una persona puede heredar un gen de cada padre. La talasemia puede ser leve o grave. Depende de la cantidad de mutaciones genticas con las que se nace. Cuanto ms mutaciones genticas se tengan, ms grave ser la afeccin. Una persona que hereda un solo gen ser portadora de la afeccin (rasgo talasmico). Ardelia Mems persona con rasgo talasmico puede no tener ningn sntoma o tener solo anemia leve. Una persona que hereda dos o ms genes puede tener talasemia menor, talasemia intermedia o talasemia mayor. La talasemia es una afeccin de por vida. No hay cura, pero el tratamiento puede [employer] sntomas y Engineer, manufacturing systems. Cules son las causas? La talasemia se debe a mutaciones genticas que se transmiten de padres a hijos. Qu incrementa el riesgo? Es ms probable que contraiga esta afeccin si:  Tiene antecedentes familiares de talasemia.  Sus progenitores provienen de [place], [place], el sudeste de [place], [place], [place]. Cules son los signos o sntomas? Los signos y sntomas ms frecuentes de la talasemia son los  signos y los sntomas de la anemia. Estos incluyen los siguientes:  Debilidad.  Cansancio.  Palpitaciones.  Mareos.  Dolor de [place].  Calambres en las piernas.  Piel plida.  Confusin.  Falta de aire. Tambin pueden presentarse otros signos y sntomas. Es posible que tenga lo siguiente:  Piel u ojos amarillentos y [place] de color oscuro (ictericia). La desintegracin de glbulos rojos puede causar la acumulacin de un pigmento de color amarillento (bilirrubina) en la sangre.  Huesos dbiles (osteoporosis) y fracturas en los [place]. Esto se debe a que los [employer] se pueden debilitar debido al esfuerzo de producir ms hemoglobina.  Agrandamiento del bazo. Esto puede causar una inflamacin en el vientre. El bazo puede agrandarse debido a la filtracin de glbulos rojos muertos.  Infecciones graves y frecuentes. Esto ocurre si el bazo y la mdula sea se debilitan. Estos rganos producen los glbulos blancos que el organismo necesita para combatir las infecciones. Cmo se diagnostica? El mdico puede sospechar la presencia de talasemia en funcin de sus signos y sntomas, en especial si tiene antecedentes familiares de esta afeccin. Esta afeccin se puede diagnosticar a travs de lo siguiente:  En la niez, si tiene una forma grave de talasemia. Esto se debe a que los sntomas se manifiestan en las primeras etapas de la vida.  En el nacimiento. En los [employer], los bebs se someten a pruebas sistemticas de deteccin de esta afeccin.  En la edad adulta, si tiene el rasgo talasmico o Librarian, academic. Esto ocurre si comienzan a Scientist, forensic los sntomas de la anemia o si los anlisis de [place] de rutina revelan una anemia  que no se puede explicar. Los [employer] de sangre pueden confirmar el diagnstico de talasemia. Los [employer] de sangre pueden mostrar:  Un nivel bajo de hemoglobina.  Un nivel bajo de hierro.  Un nivel anormal de hemoglobina.  Mutaciones genticas de  la talasemia. Tal vez deba consultar a un mdico especialista en enfermedades de Herbalist (hematlogo). Cmo se trata? El tratamiento de esta afeccin depende del tipo de talasemia que tenga:  Si tiene el rasgo talasmico o talasemia menor, es posible que no necesite tratamiento. Sin embargo, es posible que necesite tratamiento si tiene Librarian, academic y presenta sntomas durante una infeccin.  Si tiene talasemia intermedia, tendr sntomas que requieren tratamiento.  Si tiene Media planner, tendr sntomas graves que requieren tratamiento regular. El tratamiento de la talasemia puede incluir:  Sangre donada (transfusiones) para reponer los glbulos rojos.  Suplementos de vitamina B (cido flico) para ayudar a producir hemoglobina y glbulos rojos.  Medicamentos o inyecciones para retirar la acumulacin de hierro (quelacin). Esto puede ocurrir en personas que tienen transfusiones frecuentes. La sobrecarga de hierro puede daar el corazn, el hgado y las neuronas.  En el caso de la talasemia grave: ? Puede ser necesario extirpar el bazo si se daa. ? Puede ser necesario el trasplante de clulas madre o de mdula sea para trasplantar clulas que puedan producir glbulos rojos. Esto puede realizarse si las transfusiones no resultan eficaces. Siga estas indicaciones en su casa: Comida y bebida  Siga las indicaciones del mdico respecto de las restricciones de comidas o bebidas. Es posible que deba evitar alimentos o bebidas con alto contenido de hierro o fortificados con hierro.  Consuma alimentos ricos en fibra, como frutas y verduras frescas, cereales integrales y frijoles. Limite el consumo de alimentos ricos en grasas y azcares procesados, como alimentos fritos o dulces. La alimentacin es importante para prevenir la anemia.   Actividad  Retome sus actividades normales como se lo haya indicado el mdico. Pregntele al mdico qu actividades son seguras para usted.  Hacer  ejercicio es importante para Consulting civil engineer energa y los huesos fuertes. Consulte al mdico qu cantidad y tipo de ejercicios son seguros para usted. Indicaciones generales  [employer] de venta libre y los recetados solamente como se lo haya indicado el mdico.  Mantngase al da con todas las vacunas de rutina y las vacunas antigripales para reducir el riesgo de contraer una infeccin.  Lave sus manos con frecuencia.  Haga lo posible para Clinical cytogeneticist cerca de personas enfermas y mantngase alejado [place] resfro y gripe.  Renase con un asesor gentico si est o puede quedar embarazada. Un asesor gentico [employer] riesgos de transmitir la talasemia a su hijo.  Concurra a todas las visitas de seguimiento como se lo haya indicado el mdico. Esto es importante.   Comunquese con un mdico si:  Tiene signos o sntomas de anemia.  Tiene fiebre u otros signos de infeccin.  Tiene el abdomen hinchado.  Tiene ictericia. Solicite ayuda de inmediato si:  Se siente muy dbil o le falta el aire. Resumen  La talasemia es un trastorno sanguneo que causa anemia.  La talasemia puede variar de leve a grave.  Esta afeccin se transmite de padres a hijos.  No hay cura pero el tratamiento puede controlar los sntomas y prevenir la anemia. Esta informacin no tiene Marine scientist el consejo del mdico. Asegrese de hacerle al mdico cualquier pregunta que tenga. Document Revised: 01/19/2018 Document  Reviewed: 01/19/2018 Elsevier Patient Education  Morgan Singleton.

## 2020-12-10 NOTE — Progress Notes (Signed)
   PRENATAL VISIT NOTE  Subjective:  Morgan Singleton is a 45 y.o. [id number] at [redacted]w[redacted]d being seen today for ongoing prenatal care.  She is currently monitored for the following issues for this high-risk pregnancy and has FIBROCYSTIC BREAST DISEASE; IUFD at less than 20 weeks of gestation; Supervision of high risk pregnancy, antepartum; Language barrier; Multigravida of advanced maternal age in second trimester; Alpha thalassemia silent carrier; Placenta previa; and [redacted] weeks gestation of pregnancy on their problem list.  Patient doing well with no acute concerns today. She reports no complaints.  Contractions: Not present. Vag. Bleeding: None.  Movement: Present. Denies leaking of fluid.   Pt had two spot of blood earlier in the week.  She did not go to the hospital.  Reviewed placenta previa with patient in detail with instructions for follow up if there was any significant bleeding.  Reviewed labwork with patient.  Patient concerned with alpha thalassemia carrier status.  Pt referred to genetic counseling  The following portions of the patient's history were reviewed and updated as appropriate: allergies, current medications, past family history, past medical history, past social history, past surgical history and problem list. Problem list updated.  Objective:   Vitals:   12/10/20 0823  BP: 98/72  Pulse: 90  Weight: 127 lb 3.2 oz (57.7 kg)    Fetal Status: Fetal Heart Rate (bpm): 142   Movement: Present     General:  Alert, oriented and cooperative. Patient is in no acute distress.  Skin: Skin is warm and dry. No rash noted.   Cardiovascular: Normal heart rate noted  Respiratory: Normal respiratory effort, no problems with respiration noted  Abdomen: Soft, gravid, appropriate for gestational age.  Pain/Pressure: Absent     Pelvic: Cervical exam deferred        Extremities: Normal range of motion.  Edema: None  Mental Status:  Normal mood and affect. Normal behavior. Normal judgment  and thought content.   Assessment and Plan:  Pregnancy: [id number] at 107w5d  1. Supervision of high risk pregnancy, antepartum Continue routine care  2. Language barrier Interpreter present  3. Alpha thalassemia silent carrier Referred to counseling, spouse may be tested - AMB MFM GENETICS REFERRAL  4. [redacted] weeks gestation of pregnancy   5. Placenta previa in second trimester Reviewed with patient, pelvic rest  6. Multigravida of advanced maternal age in second trimester   Preterm labor symptoms and general obstetric precautions including but not limited to vaginal bleeding, contractions, leaking of fluid and fetal movement were reviewed in detail with the patient.  Please refer to After Visit Summary for other counseling recommendations.   Return in about 4 weeks (around 01/07/2021) for [facility], in person.   Lynnda Shields, MD Faculty Attending Center [facility]

## 2020-12-19 ENCOUNTER — Other Ambulatory Visit: Payer: Self-pay

## 2020-12-19 ENCOUNTER — Inpatient Hospital Stay (HOSPITAL_COMMUNITY)
Admission: AD | Admit: 2020-12-19 | Discharge: 2020-12-20 | Disposition: A | Discharge status: Home / Self Care | Payer: Medicaid Other | Attending: Obstetrics and Gynecology | Admitting: Obstetrics and Gynecology

## 2020-12-19 ENCOUNTER — Encounter (HOSPITAL_COMMUNITY): Payer: Self-pay | Admitting: Obstetrics and Gynecology

## 2020-12-19 DIAGNOSIS — Z7982 Long term (current) use of aspirin: Secondary | ICD-10-CM | POA: Diagnosis not present

## 2020-12-19 DIAGNOSIS — O4412 Placenta previa with hemorrhage, second trimester: Secondary | ICD-10-CM | POA: Insufficient documentation

## 2020-12-19 DIAGNOSIS — Z3A21 21 weeks gestation of pregnancy: Secondary | ICD-10-CM | POA: Diagnosis not present

## 2020-12-19 DIAGNOSIS — O4402 Placenta previa specified as without hemorrhage, second trimester: Secondary | ICD-10-CM

## 2020-12-19 DIAGNOSIS — O0990 Supervision of high risk pregnancy, unspecified, unspecified trimester: Secondary | ICD-10-CM

## 2020-12-19 NOTE — MAU Provider Note (Signed)
Patient Morgan Singleton is a 45 y.o. [id number]  at [redacted]w[redacted]d here with complaints of vaginal bleeding yesterday and this afternoon. She denies contractions, LOF, fever, headache, nausea, vomiting, diarrhea. She has had some constipation.   She had IC three days ago.  She was diagnosed with anterior previa on 4/14, was advised that previa may resolve with growth and that she should be on pelvic rest for 4 weeks. She has follow up scan on 5/15.  History     CSN: [id number]  Arrival date and time: 12/19/20 2301   Event Date/Time   First Provider Initiated Contact with Patient 12/19/20 2351      Chief Complaint  Patient presents with  . Vaginal Bleeding   Vaginal Bleeding The patient's primary symptoms include vaginal bleeding. This is a new problem. The current episode started yesterday. The problem occurs intermittently. The pain is mild. She is pregnant. Pertinent negatives include no back pain, constipation, diarrhea, dysuria, fever, urgency or vomiting. The vaginal discharge was bloody. The vaginal bleeding is spotting. She has not been passing clots. She has not been passing tissue.    OB History    Gravida  7   Para  3   Term  3   Preterm      AB  3   Living  3     SAB  2   IAB  0   Ectopic  1   Multiple  0   Live Births  3           Past Medical History:  Diagnosis Date  . History of ectopic pregnancy   . No pertinent past medical history   . THYROMEGALY 06/08/2006   Annotation: TSH was normal in 2007 Qualifier: Diagnosis of  By: Amil Amen MD, Benjamine Mola      Past Surgical History:  Procedure Laterality Date  . LAPAROSCOPY  04/26/2012   Procedure: LAPAROSCOPY OPERATIVE;  Surgeon: Mora Bellman, MD;  Location: [facility];  Service: Gynecology;  Laterality: N/A;  Left Salpingectomy    Family History  Problem Relation Age of Onset  . Hypertension Father   . Diabetes Maternal Aunt   . Diabetes Paternal Grandfather   . Hypertension Mother     Social  History   Tobacco Use  . Smoking status: Never Smoker  . Smokeless tobacco: Never Used  Vaping Use  . Vaping Use: Never used  Substance Use Topics  . Alcohol use: Not Currently    Comment: occassionally  . Drug use: No    Allergies: No Known Allergies  Medications Prior to Admission  Medication Sig Dispense Refill Last Dose  . aspirin EC 81 MG tablet Take 1 tablet (81 mg total) by mouth daily. 60 tablet 2   . Blood Pressure Monitoring DEVI 1 each by Does not apply route once a week. 1 each 0   . prenatal vitamin w/FE, FA (PRENATAL 1 + 1) 27-1 MG TABS tablet Take 1 tablet by mouth daily at 12 noon.       Review of Systems  Constitutional: Negative.  Negative for fever.  HENT: Negative.   Respiratory: Negative.   Gastrointestinal: Negative for constipation, diarrhea and vomiting.  Genitourinary: Positive for vaginal bleeding. Negative for dysuria and urgency.  Musculoskeletal: Negative.  Negative for back pain.  Neurological: Negative.   Psychiatric/Behavioral: Negative.    Physical Exam   Blood pressure 111/70, pulse 82, temperature 98.2 F (36.8 C), temperature source Oral, resp. rate 16, last menstrual period 07/15/2020, SpO2 100 %.  Physical Exam Constitutional:      Appearance: Normal appearance.  HENT:     Head: Normocephalic.  Cardiovascular:     Rate and Rhythm: Normal rate.  Genitourinary:    Vagina: No vaginal discharge.     Comments: NEFG; no blood in the vagina, no discharge, cervical exam not performed Musculoskeletal:     Cervical back: Normal range of motion.  Neurological:     Mental Status: She is alert.     MAU Course  Procedures  MDM -will do [place] to check previa -preliminary read makes no mention of previa; FHR is 142 -wet prep shows clue cells but patient has no complaints of discharge, odor, discomfort and no discharge is seen on exam -Patient had no bleeding while in MAU Assessment and Plan   1. Placenta previa antepartum in second  trimester   2. Supervision of high risk pregnancy, antepartum    -patient stable for discharge with no further bleeding in MAU -keep appt on 5/12 -discussed need for strict pelvic rest -discussed bleeding and return precautions -reviewed blood type (O pos) -all questions answered  Mervyn Skeeters [facility] 12/19/2020, 11:52 PM

## 2020-12-19 NOTE — MAU Note (Signed)
Pt reports she has had a little bleeding and was told to come to the hospital if she had any due to placenta previa. Pinkish discharge yesterday, more red today.

## 2020-12-20 ENCOUNTER — Inpatient Hospital Stay (HOSPITAL_BASED_OUTPATIENT_CLINIC_OR_DEPARTMENT_OTHER): Payer: Medicaid Other

## 2020-12-20 DIAGNOSIS — O4692 Antepartum hemorrhage, unspecified, second trimester: Secondary | ICD-10-CM

## 2020-12-20 DIAGNOSIS — Z3A21 21 weeks gestation of pregnancy: Secondary | ICD-10-CM | POA: Diagnosis not present

## 2020-12-20 DIAGNOSIS — O321XX0 Maternal care for breech presentation, not applicable or unspecified: Secondary | ICD-10-CM

## 2020-12-20 LAB — WET PREP, GENITAL
Sperm: NONE SEEN
Trich, Wet Prep: NONE SEEN
Yeast Wet Prep HPF POC: NONE SEEN

## 2020-12-21 LAB — GC/CHLAMYDIA PROBE AMP (~~LOC~~) NOT AT ARMC
Chlamydia: NEGATIVE
Comment: NEGATIVE
Comment: NORMAL
Neisseria Gonorrhea: NEGATIVE

## 2021-01-07 ENCOUNTER — Ambulatory Visit (INDEPENDENT_AMBULATORY_CARE_PROVIDER_SITE_OTHER): Payer: Medicaid Other | Admitting: Obstetrics and Gynecology

## 2021-01-07 ENCOUNTER — Ambulatory Visit: Payer: Medicaid Other | Attending: Obstetrics

## 2021-01-07 ENCOUNTER — Ambulatory Visit: Payer: Medicaid Other | Admitting: *Deleted

## 2021-01-07 ENCOUNTER — Other Ambulatory Visit: Payer: Self-pay

## 2021-01-07 ENCOUNTER — Encounter: Payer: Self-pay | Admitting: *Deleted

## 2021-01-07 VITALS — BP 104/70 | HR 88 | Wt 132.2 lb

## 2021-01-07 DIAGNOSIS — Z3A23 23 weeks gestation of pregnancy: Secondary | ICD-10-CM | POA: Diagnosis not present

## 2021-01-07 DIAGNOSIS — O322XX0 Maternal care for transverse and oblique lie, not applicable or unspecified: Secondary | ICD-10-CM | POA: Diagnosis not present

## 2021-01-07 DIAGNOSIS — O4692 Antepartum hemorrhage, unspecified, second trimester: Secondary | ICD-10-CM

## 2021-01-07 DIAGNOSIS — O09522 Supervision of elderly multigravida, second trimester: Secondary | ICD-10-CM | POA: Diagnosis not present

## 2021-01-07 DIAGNOSIS — O0990 Supervision of high risk pregnancy, unspecified, unspecified trimester: Secondary | ICD-10-CM

## 2021-01-07 DIAGNOSIS — Z363 Encounter for antenatal screening for malformations: Secondary | ICD-10-CM

## 2021-01-07 DIAGNOSIS — Z148 Genetic carrier of other disease: Secondary | ICD-10-CM | POA: Diagnosis not present

## 2021-01-07 DIAGNOSIS — O09292 Supervision of pregnancy with other poor reproductive or obstetric history, second trimester: Secondary | ICD-10-CM

## 2021-01-07 DIAGNOSIS — O4402 Placenta previa specified as without hemorrhage, second trimester: Secondary | ICD-10-CM

## 2021-01-07 NOTE — Patient Instructions (Signed)
Placenta previa Placenta Previa  La placenta es el rgano que se forma durante el [place]. Transporta oxgeno y nutrientes al beb en gestacin (feto). La placenta es el sistema que sustenta la vida del beb. La placenta previa ocurre cuando la placenta se implanta en la parte inferior del tero. La placenta cubre de manera parcial o total la abertura del cuello uterino. Esto puede causar hemorragias graves durante el final del embarazo o [place]. Si la placenta previa se diagnostica durante la primera mitad del Media planner, es posible que la placenta se desplace hacia una posicin normal mientras avanza el [place]. Es importante que cumpla con todas las visitas prenatales para estar controlada de cerca por el mdico. Cules son las causas? Se desconoce la causa de esta afeccin. Qu incrementa el riesgo? Los siguientes factores pueden hacer que sea ms propensa a Armed forces training and education officer afeccin:  Tener cicatrices en la capa que recubre el tero.  Haber tenido otros embarazos.  Haber tenido [employer], como un parto por cesrea.  Tener antecedentes de placenta previa.  Haber fumado o usado cocana durante el [place].  Tener 35aos o ms durante el embarazo. Cules son los signos o sntomas? El sntoma principal de la placenta previa es un sangrado vaginal de color rojo brillante, indoloro y sbito durante la segunda mitad del [place]. La cantidad de sangrado puede ser muy poca al principio y [place], el sangrado generalmente desaparece por s solo. Tambin puede haber episodios de mucho sangrado. Es posible que algunas mujeres con placenta previa no sangren nada. Cmo se diagnostica? Esta afeccin se puede diagnosticar durante una ecografa de rutina o durante un control despus de que se advierte sangrado vaginal. En general:  Si se le diagnostica placenta previa, se evitarn los exmenes digitales en los que se [employer] dedos. Sin embargo, su Special educational needs teacher un examen con  el espculo.  Si nunca le hicieron Therapist, sports, es posible que la placenta previa no se diagnostique hasta que se produzca el sangrado durante el [place]. Cmo se trata? El tratamiento de esta afeccin depende de lo siguiente:  La cantidad de sangrado o si este se ha detenido.  En qu etapa se encuentra del embarazo.  El [place] del beb.  Qu proporcin de la placenta cubre el cuello uterino. El tratamiento puede incluir:  Disminucin de la [place].  Hacer reposo en cama, en su casa o en el hospital.  Reposo plvico. No se debe introducir nada en la vagina durante el reposo plvico. Esto significa no mantener relaciones sexuales, no usar tampones ni hacerse duchas vaginales.  Una transfusin de sangre para reemplazar la sangre que haya perdido (prdida de [place]).  Parto por cesrea. Es necesario si: ? La hemorragia vuelve y no se IT consultant. ? La placenta cubre completamente el cuello uterino.  Medicamentos para Civil Service fast streamer prematuro o ayudar a Neurosurgeon los pulmones del beb. Este tratamiento se puede utilizar si es necesario que el beb nazca antes de que el embarazo llegue a trmino. Siga estas instrucciones en su casa:  Haga mucho reposo y Psychologist, prison and probation services las actividades como se lo haya indicado el mdico.  Si se lo indica el mdico, Nature conservation officer en reposo en cama durante el tiempo recomendado.  No mantenga relaciones sexuales, no use tampones, no se haga duchas vaginales ni introduzca nada en su vagina si el mdico le recomienda reposo plvico.  Use los medicamentos de venta libre y los recetados solamente como se lo  haya indicado el [place]. Esto es importante. Solicite ayuda de inmediato si:  Tiene sangrado vaginal, aunque sea indoloro o en una pequea cantidad.  Tiene clicos o contracciones regulares.  Siente dolor en la parte inferior de la espalda o en el abdomen.  Tiene una  sensacin de presin en aumento en la pelvis.  Elimina cada vez ms una mucosidad acuosa o con sangre por la vagina.  No ha sentido que el beb se moviera con regularidad. Resumen  La placenta previa es un trastorno que padece una mujer embarazada cuando la placenta se implanta en la parte inferior del tero.  Se desconoce la causa de esta afeccin.  El sntoma ms frecuente de la placenta previa es un sangrado indoloro de color rojo brillante durante el embarazo.  Es importante que cumpla con todos los controles prenatales para estar controlada de cerca por su mdico.  Obtenga ayuda de inmediato si tiene placenta previa y tiene sangrado durante el Media planner. Esta informacin no tiene Marine scientist el consejo del mdico. Asegrese de hacerle al mdico cualquier pregunta que tenga. Document Revised: 06/12/2020 Document Reviewed: 05/01/2020 Elsevier Patient Education  2021 [employer].

## 2021-01-07 NOTE — Progress Notes (Signed)
   PRENATAL VISIT NOTE  Subjective:  Morgan Singleton is a 45 y.o. [id number] at [redacted]w[redacted]d being seen today for ongoing prenatal care.  She is currently monitored for the following issues for this high-risk pregnancy and has FIBROCYSTIC BREAST DISEASE; IUFD at less than 20 weeks of gestation; Supervision of high risk pregnancy, antepartum; Language barrier; Multigravida of advanced maternal age in second trimester; Alpha thalassemia silent carrier; and Placenta previa on their problem list.  Patient reports no complaints.  Contractions: Irritability.  .  Movement: Present. Denies leaking of fluid.   Reports some dark blood on toilet paper when she wipes. Happens 2-3 times per week. No bright red blood. No clots. No cramping. Has abided by recommendations for pelvic rest. Seen in MAU and had limited US that showed possible resolution of previa.   The following portions of the patient's history were reviewed and updated as appropriate: allergies, current medications, past family history, past medical history, past social history, past surgical history and problem list.   Objective:   Vitals:   01/07/21 0821  BP: 104/70  Pulse: 88  Weight: 132 lb 3.2 oz (60 kg)    Fetal Status: Fetal Heart Rate (bpm): 140   Movement: Present     General:  Alert, oriented and cooperative. Patient is in no acute distress.  Skin: Skin is warm and dry. No rash noted.   Cardiovascular: Normal heart rate noted  Respiratory: Normal respiratory effort, no problems with respiration noted  Abdomen: Soft, gravid, appropriate for gestational age.  Pain/Pressure: Present     Pelvic: Cervical exam deferred        Extremities: Normal range of motion.     Mental Status: Normal mood and affect. Normal behavior. Normal judgment and thought content.   Assessment and Plan:  Pregnancy: [id number] at [redacted]w[redacted]d 1. [redacted] weeks gestation of pregnancy -next visit for 2hr gtt  2. Supervision of high risk pregnancy, antepartum   3.  Placenta previa antepartum in second trimester -reporting some dark blood when wiping, no clots or bright red bleeding. Discussed pelvic rest. Has [place] upstairs this AM.   Preterm labor symptoms and general obstetric precautions including but not limited to vaginal bleeding, contractions, leaking of fluid and fetal movement were reviewed in detail with the patient. Please refer to After Visit Summary for other counseling recommendations.   Return in about 4 weeks (around 02/04/2021) for OB.  Future Appointments  Date Time Provider [place]  01/07/2021  9:30 AM [facility] NURSE [facility]  01/07/2021  9:45 AM [facility] US5 [facility]    Janet Berlin, MD

## 2021-01-07 NOTE — Progress Notes (Signed)
C/o"intermittent spotting x 2-3 weeks."

## 2021-01-08 ENCOUNTER — Other Ambulatory Visit: Payer: Self-pay | Admitting: *Deleted

## 2021-01-08 DIAGNOSIS — O09522 Supervision of elderly multigravida, second trimester: Secondary | ICD-10-CM

## 2021-02-02 ENCOUNTER — Other Ambulatory Visit: Payer: Self-pay | Admitting: General Practice

## 2021-02-02 DIAGNOSIS — O0990 Supervision of high risk pregnancy, unspecified, unspecified trimester: Secondary | ICD-10-CM

## 2021-02-04 ENCOUNTER — Other Ambulatory Visit: Payer: Self-pay

## 2021-02-04 ENCOUNTER — Other Ambulatory Visit: Payer: Medicaid Other

## 2021-02-04 ENCOUNTER — Ambulatory Visit (INDEPENDENT_AMBULATORY_CARE_PROVIDER_SITE_OTHER): Payer: Medicaid Other | Admitting: Obstetrics and Gynecology

## 2021-02-04 VITALS — BP 101/70 | HR 91 | Wt 136.2 lb

## 2021-02-04 DIAGNOSIS — O4402 Placenta previa specified as without hemorrhage, second trimester: Secondary | ICD-10-CM

## 2021-02-04 DIAGNOSIS — O0990 Supervision of high risk pregnancy, unspecified, unspecified trimester: Secondary | ICD-10-CM | POA: Diagnosis not present

## 2021-02-04 DIAGNOSIS — Z23 Encounter for immunization: Secondary | ICD-10-CM

## 2021-02-04 DIAGNOSIS — L309 Dermatitis, unspecified: Secondary | ICD-10-CM

## 2021-02-04 DIAGNOSIS — O09522 Supervision of elderly multigravida, second trimester: Secondary | ICD-10-CM

## 2021-02-04 DIAGNOSIS — D563 Thalassemia minor: Secondary | ICD-10-CM

## 2021-02-04 DIAGNOSIS — Z789 Other specified health status: Secondary | ICD-10-CM

## 2021-02-04 MED ORDER — CETIRIZINE HCL 10 MG PO TABS
10.0000 mg | ORAL_TABLET | Freq: Every day | ORAL | 0 refills | Status: DC
Start: 2021-02-04 — End: 2021-04-23

## 2021-02-04 NOTE — Progress Notes (Signed)
     PRENATAL VISIT NOTE  Subjective:  Morgan Singleton is a 45 y.o. [id number] at [redacted]w[redacted]d being seen today for ongoing prenatal care.  She is currently monitored for the following issues for this high-risk pregnancy and has FIBROCYSTIC BREAST DISEASE; IUFD at less than 20 weeks of gestation; Supervision of high risk pregnancy, antepartum; Language barrier; Multigravida of advanced maternal age in second trimester; Alpha thalassemia silent carrier; and Placenta previa on their problem list.  Patient reports dermatitis on chest, arms from poison ivy. Has tried OTC steroid cream and no help.  Contractions: Irritability. Vag. Bleeding: None.   . Denies leaking of fluid.   The following portions of the patient's history were reviewed and updated as appropriate: allergies, current medications, past family history, past medical history, past social history, past surgical history and problem list.   Objective:   Vitals:   02/04/21 0917  BP: 101/70  Pulse: 91  Weight: 136 lb 3.2 oz (61.8 kg)    Fetal Status: Fetal Heart Rate (bpm): 142         General:  Alert, oriented and cooperative. Patient is in no acute distress.  Skin: Skin is warm and dry. No rash noted.   Cardiovascular: Normal heart rate noted  Respiratory: Normal respiratory effort, no problems with respiration noted  Abdomen: Soft, gravid, appropriate for gestational age.  Pain/Pressure: Present     Pelvic: Cervical exam deferred        Extremities: Normal range of motion.  Edema: None  Mental Status: Normal mood and affect. Normal behavior. Normal judgment and thought content.   Assessment and Plan:  Pregnancy: [id number] at [redacted]w[redacted]d 1. Supervision of high risk pregnancy, antepartum 28wk labs today. Ask about birth control nv in case needs to sign BTL papers  2. Alpha thalassemia silent carrier Partner testing recommended  3. Language barrier Interpreter used  4. Multigravida of advanced maternal age in second  trimester Getting growth u/s. Start ap testing at [place]. Delivery at 39wks 5/12: 88%, 733gm, ac 97%, afi normal  5. Dermatitis Can try zyrtec OTC.   6. Placenta previa in second trimester Resolved? Patient denies any vag spotting or bleeding. Pt has rpt u/s on Monday and she is wondering if she needs this and she does based on her age. I can't tell if she does and if the placenta previa has resolved based on her last u/s read from 5/12. F/u u/s next week  Preterm labor symptoms and general obstetric precautions including but not limited to vaginal bleeding, contractions, leaking of fluid and fetal movement were reviewed in detail with the patient. Please refer to After Visit Summary for other counseling recommendations.   Return in about 2 weeks (around 02/18/2021) for in person, md or app, low risk ob.  Future Appointments  Date Time Provider [place]  02/08/2021  8:00 AM [facility] NURSE [facility]  02/08/2021  8:15 AM [facility] US2 [facility]    Aletha Halim, MD

## 2021-02-05 LAB — GLUCOSE TOLERANCE, 2 HOURS W/ 1HR
Glucose, 1 hour: 110 mg/dL (ref 65–179)
Glucose, 2 hour: 101 mg/dL (ref 65–152)
Glucose, Fasting: 74 mg/dL (ref 65–91)

## 2021-02-05 LAB — CBC
Hematocrit: 37.5 % (ref 34.0–46.6)
Hemoglobin: 12.4 g/dL (ref 11.1–15.9)
MCH: 27.9 pg (ref 26.6–33.0)
MCHC: 33.1 g/dL (ref 31.5–35.7)
MCV: 85 fL (ref 79–97)
Platelets: 193 10*3/uL (ref 150–450)
RBC: 4.44 x10E6/uL (ref 3.77–5.28)
RDW: 15.2 % (ref 11.7–15.4)
WBC: 7.6 10*3/uL (ref 3.4–10.8)

## 2021-02-05 LAB — RPR: RPR Ser Ql: NONREACTIVE

## 2021-02-05 LAB — HIV ANTIBODY (ROUTINE TESTING W REFLEX): HIV Screen 4th Generation wRfx: NONREACTIVE

## 2021-02-08 ENCOUNTER — Other Ambulatory Visit: Payer: Self-pay | Admitting: *Deleted

## 2021-02-08 ENCOUNTER — Ambulatory Visit: Payer: Medicaid Other | Admitting: *Deleted

## 2021-02-08 ENCOUNTER — Encounter: Payer: Self-pay | Admitting: *Deleted

## 2021-02-08 ENCOUNTER — Other Ambulatory Visit: Payer: Self-pay

## 2021-02-08 ENCOUNTER — Ambulatory Visit: Payer: Medicaid Other | Attending: Obstetrics and Gynecology

## 2021-02-08 VITALS — BP 109/63 | HR 89

## 2021-02-08 DIAGNOSIS — O09522 Supervision of elderly multigravida, second trimester: Secondary | ICD-10-CM | POA: Diagnosis not present

## 2021-02-08 DIAGNOSIS — O0990 Supervision of high risk pregnancy, unspecified, unspecified trimester: Secondary | ICD-10-CM

## 2021-02-08 DIAGNOSIS — Z148 Genetic carrier of other disease: Secondary | ICD-10-CM | POA: Diagnosis not present

## 2021-02-08 DIAGNOSIS — O322XX0 Maternal care for transverse and oblique lie, not applicable or unspecified: Secondary | ICD-10-CM | POA: Diagnosis not present

## 2021-02-08 DIAGNOSIS — Z3A28 28 weeks gestation of pregnancy: Secondary | ICD-10-CM | POA: Diagnosis not present

## 2021-02-08 DIAGNOSIS — Z362 Encounter for other antenatal screening follow-up: Secondary | ICD-10-CM | POA: Diagnosis not present

## 2021-02-08 DIAGNOSIS — Z8759 Personal history of other complications of pregnancy, childbirth and the puerperium: Secondary | ICD-10-CM

## 2021-02-08 DIAGNOSIS — O09293 Supervision of pregnancy with other poor reproductive or obstetric history, third trimester: Secondary | ICD-10-CM

## 2021-02-08 DIAGNOSIS — O09523 Supervision of elderly multigravida, third trimester: Secondary | ICD-10-CM

## 2021-02-08 DIAGNOSIS — O4693 Antepartum hemorrhage, unspecified, third trimester: Secondary | ICD-10-CM | POA: Diagnosis not present

## 2021-02-08 NOTE — Progress Notes (Signed)
C/o "small amount of blood noted on toilet paper when wiping after straining to have BM."

## 2021-02-18 ENCOUNTER — Ambulatory Visit (INDEPENDENT_AMBULATORY_CARE_PROVIDER_SITE_OTHER): Payer: Medicaid Other | Admitting: Student

## 2021-02-18 ENCOUNTER — Other Ambulatory Visit: Payer: Self-pay

## 2021-02-18 VITALS — BP 99/68 | HR 99 | Wt 139.2 lb

## 2021-02-18 DIAGNOSIS — O0990 Supervision of high risk pregnancy, unspecified, unspecified trimester: Secondary | ICD-10-CM

## 2021-02-18 DIAGNOSIS — Z3A29 29 weeks gestation of pregnancy: Secondary | ICD-10-CM

## 2021-02-18 NOTE — Progress Notes (Signed)
   PRENATAL VISIT NOTE  Subjective:  Morgan Singleton is a 45 y.o. [id number] at [redacted]w[redacted]d being seen today for ongoing prenatal care.  She is currently monitored for the following issues for this high-risk pregnancy and has FIBROCYSTIC BREAST DISEASE; IUFD at less than 20 weeks of gestation; Supervision of high risk pregnancy, antepartum; Language barrier; Multigravida of advanced maternal age in second trimester; Alpha thalassemia silent carrier; and Placenta previa on their problem list.  Patient reports no complaints.  Contractions: Irritability. Vag. Bleeding: None.  Movement: Present. Denies leaking of fluid.   The following portions of the patient's history were reviewed and updated as appropriate: allergies, current medications, past family history, past medical history, past social history, past surgical history and problem list.   Objective:   Vitals:   02/18/21 1026  BP: 99/68  Pulse: 99  Weight: 139 lb 3.2 oz (63.1 kg)    Fetal Status: Fetal Heart Rate (bpm): 131 Fundal Height: 30 cm Movement: Present     General:  Alert, oriented and cooperative. Patient is in no acute distress.  Skin: Skin is warm and dry. No rash noted.   Cardiovascular: Normal heart rate noted  Respiratory: Normal respiratory effort, no problems with respiration noted  Abdomen: Soft, gravid, appropriate for gestational age.  Pain/Pressure: Absent     Pelvic: Cervical exam deferred        Extremities: Normal range of motion.  Edema: None  Mental Status: Normal mood and affect. Normal behavior. Normal judgment and thought content.   Assessment and Plan:  Pregnancy: [id number] at [redacted]w[redacted]d  1. [redacted] weeks gestation of pregnancy   2. Supervision of high risk pregnancy, antepartum    -will sign BTL today -reviewed GDM results (passed) -needs pap PP   Preterm labor symptoms and general obstetric precautions including but not limited to vaginal bleeding, contractions, leaking of fluid and fetal movement were  reviewed in detail with the patient. Please refer to After Visit Summary for other counseling recommendations.   Return in about 2 weeks (around 03/04/2021), or LROB with [place].  Future Appointments  Date Time Provider [place]  03/08/2021  8:30 AM [facility] NURSE [facility]  03/08/2021  8:45 AM [facility] US4 [facility]  03/15/2021  8:15 AM [facility] NST [facility]  03/22/2021  8:15 AM [facility] NST [facility] [place], CNM

## 2021-03-08 ENCOUNTER — Other Ambulatory Visit: Payer: Self-pay | Admitting: *Deleted

## 2021-03-08 ENCOUNTER — Ambulatory Visit: Payer: Medicaid Other

## 2021-03-08 ENCOUNTER — Other Ambulatory Visit: Payer: Self-pay

## 2021-03-08 ENCOUNTER — Ambulatory Visit: Payer: Medicaid Other | Attending: Obstetrics

## 2021-03-08 ENCOUNTER — Ambulatory Visit: Payer: Medicaid Other | Admitting: *Deleted

## 2021-03-08 ENCOUNTER — Encounter: Payer: Self-pay | Admitting: *Deleted

## 2021-03-08 VITALS — BP 114/71 | HR 79

## 2021-03-08 DIAGNOSIS — Z8759 Personal history of other complications of pregnancy, childbirth and the puerperium: Secondary | ICD-10-CM

## 2021-03-08 DIAGNOSIS — Z3A32 32 weeks gestation of pregnancy: Secondary | ICD-10-CM

## 2021-03-08 DIAGNOSIS — O09523 Supervision of elderly multigravida, third trimester: Secondary | ICD-10-CM

## 2021-03-08 DIAGNOSIS — O0990 Supervision of high risk pregnancy, unspecified, unspecified trimester: Secondary | ICD-10-CM | POA: Diagnosis present

## 2021-03-08 DIAGNOSIS — O09293 Supervision of pregnancy with other poor reproductive or obstetric history, third trimester: Secondary | ICD-10-CM | POA: Diagnosis not present

## 2021-03-08 DIAGNOSIS — Z362 Encounter for other antenatal screening follow-up: Secondary | ICD-10-CM | POA: Diagnosis not present

## 2021-03-08 DIAGNOSIS — O322XX0 Maternal care for transverse and oblique lie, not applicable or unspecified: Secondary | ICD-10-CM

## 2021-03-08 DIAGNOSIS — Z148 Genetic carrier of other disease: Secondary | ICD-10-CM | POA: Diagnosis not present

## 2021-03-09 ENCOUNTER — Ambulatory Visit (INDEPENDENT_AMBULATORY_CARE_PROVIDER_SITE_OTHER): Payer: Medicaid Other | Admitting: Student

## 2021-03-09 VITALS — BP 108/74 | HR 89 | Wt 139.5 lb

## 2021-03-09 DIAGNOSIS — Z3A32 32 weeks gestation of pregnancy: Secondary | ICD-10-CM

## 2021-03-09 DIAGNOSIS — O09523 Supervision of elderly multigravida, third trimester: Secondary | ICD-10-CM

## 2021-03-09 NOTE — Progress Notes (Signed)
Patient ID: Morgan Singleton, female   DOB: 1975-11-11, 45 y.o.   MRN: [id number]   PRENATAL VISIT NOTE  Subjective:  Adison Fruin is a 45 y.o. [id number] at [redacted]w[redacted]d being seen today for ongoing prenatal care.  She is currently monitored for the following issues for this high-risk pregnancy and has FIBROCYSTIC BREAST DISEASE; IUFD at less than 20 weeks of gestation; Supervision of high risk pregnancy, antepartum; Language barrier; Multigravida of advanced maternal age in second trimester; Alpha thalassemia silent carrier; and Placenta previa on their problem list.  Patient reports no complaints.  Contractions: Not present. Vag. Bleeding: None.  Movement: Present. Denies leaking of fluid.   The following portions of the patient's history were reviewed and updated as appropriate: allergies, current medications, past family history, past medical history, past social history, past surgical history and problem list.   Objective:   Vitals:   03/09/21 1536  BP: 108/74  Pulse: 89  Weight: 139 lb 8 oz (63.3 kg)    Fetal Status: Fetal Heart Rate (bpm): 140   Movement: Present     General:  Alert, oriented and cooperative. Patient is in no acute distress.  Skin: Skin is warm and dry. No rash noted.   Cardiovascular: Normal heart rate noted  Respiratory: Normal respiratory effort, no problems with respiration noted  Abdomen: Soft, gravid, appropriate for gestational age.  Pain/Pressure: Absent     Pelvic: Cervical exam deferred        Extremities: Normal range of motion.  Edema: None  Mental Status: Normal mood and affect. Normal behavior. Normal judgment and thought content.   Assessment and Plan:  Pregnancy: [id number] at [redacted]w[redacted]d 1. [redacted] weeks gestation of pregnancy   2. Multigravida of advanced maternal age in third trimester     -reviewed [place] and BPP from yesterdya -confirmed upcoming appts -resigned BTL today -discussed briefly IOL at 41 weeks   Preterm labor symptoms and general  obstetric precautions including but not limited to vaginal bleeding, contractions, leaking of fluid and fetal movement were reviewed in detail with the patient. Please refer to After Visit Summary for other counseling recommendations.   No follow-ups on file.  Future Appointments  Date Time Provider [place]  03/15/2021  8:15 AM [facility] NST [facility]  03/22/2021  8:15 AM [facility] NST [facility]  04/05/2021  9:15 AM [facility] NURSE [facility]  04/05/2021  9:30 AM [facility] US3 [facility]  04/12/2021  8:15 AM [facility] NST [facility]  04/19/2021  8:15 AM [facility] NST [facility] [place], CNM

## 2021-03-09 NOTE — Progress Notes (Signed)
No questions or concerns today

## 2021-03-10 ENCOUNTER — Encounter: Payer: Self-pay | Admitting: *Deleted

## 2021-03-15 ENCOUNTER — Ambulatory Visit (INDEPENDENT_AMBULATORY_CARE_PROVIDER_SITE_OTHER): Payer: Medicaid Other | Admitting: *Deleted

## 2021-03-15 ENCOUNTER — Other Ambulatory Visit: Payer: Self-pay

## 2021-03-15 DIAGNOSIS — O09523 Supervision of elderly multigravida, third trimester: Secondary | ICD-10-CM

## 2021-03-15 NOTE — Progress Notes (Signed)
Pt arrived for NST.BPP as scheduled. Upon review of chart, it was identified that this type of testing will not need to begin until pt is [redacted] wks EGA per Antenatal Testing guideline. This will be during week of 8/8. Pt is scheduled for [place] growth and BPP @ [facility] on 8/8, then is scheduled for weekly NST/BPP in this office beginning 8/15. Apologies were extended to pt for her inconvenience and this plan of care was explained to pt with aid from interpreter [place]. Pt voiced understanding.

## 2021-03-22 ENCOUNTER — Other Ambulatory Visit: Payer: Self-pay

## 2021-03-22 ENCOUNTER — Other Ambulatory Visit: Payer: Medicaid Other

## 2021-03-22 ENCOUNTER — Ambulatory Visit (INDEPENDENT_AMBULATORY_CARE_PROVIDER_SITE_OTHER): Payer: Medicaid Other | Admitting: Obstetrics & Gynecology

## 2021-03-22 VITALS — BP 104/72 | HR 94 | Wt 140.7 lb

## 2021-03-22 DIAGNOSIS — O0990 Supervision of high risk pregnancy, unspecified, unspecified trimester: Secondary | ICD-10-CM

## 2021-03-22 NOTE — Progress Notes (Signed)
   PRENATAL VISIT NOTE  Subjective:  Morgan Singleton is a 45 y.o. [id number] at 54w2dbeing seen today for ongoing prenatal care.  She is currently monitored for the following issues for this high-risk pregnancy and has FIBROCYSTIC BREAST DISEASE; IUFD at less than 20 weeks of gestation; Supervision of high risk pregnancy, antepartum; Language barrier; Multigravida of advanced maternal age in second trimester; Alpha thalassemia silent carrier; and Placenta previa on their problem list.  Patient reports bleeding.  Contractions: Irritability. Vag. Bleeding: Bloody Show.  Movement: Present. Denies leaking of fluid.   The following portions of the patient's history were reviewed and updated as appropriate: allergies, current medications, past family history, past medical history, past social history, past surgical history and problem list.   Objective:   Vitals:   03/22/21 1545  BP: 104/72  Pulse: 94  Weight: 140 lb 11.2 oz (63.8 kg)    Fetal Status: Fetal Heart Rate (bpm): 140   Movement: Present     General:  Alert, oriented and cooperative. Patient is in no acute distress.  Skin: Skin is warm and dry. No rash noted.   Cardiovascular: Normal heart rate noted  Respiratory: Normal respiratory effort, no problems with respiration noted  Abdomen: Soft, gravid, appropriate for gestational age.  Pain/Pressure: Present     Pelvic: Cervical exam deferred        Extremities: Normal range of motion.  Edema: None  Mental Status: Normal mood and affect. Normal behavior. Normal judgment and thought content.   Assessment and Plan:  Pregnancy: [id number] 346w2d. Supervision of high risk pregnancy, antepartum She had small amount of red bleeding 11 days ago, lasted one day, only brown spotting now, no pain  Preterm labor symptoms and general obstetric precautions including but not limited to vaginal bleeding, contractions, leaking of fluid and fetal movement were reviewed in detail with the  patient. Please refer to After Visit Summary for other counseling recommendations.   Return in about 2 weeks (around 04/05/2021).  Future Appointments  Date Time Provider DeOpa-locka8/03/2021  9:15 AM [facility]8/03/2021  9:30 AM [facility] US3 [facility]8/15/2022  8:15 AM [facility]OCA NST [facility]8/22/2022  8:15 AM [facility]A NST [facility]WH [facility]  JaEmeterio ReeveMD

## 2021-03-22 NOTE — Progress Notes (Signed)
Pt states has been having some spotting since last Thursday, only sees it now when she wipes.  Pt. Re-signed BTL consent form.

## 2021-03-29 ENCOUNTER — Telehealth: Payer: Self-pay

## 2021-04-05 ENCOUNTER — Other Ambulatory Visit (HOSPITAL_COMMUNITY)
Admission: RE | Admit: 2021-04-05 | Discharge: 2021-04-05 | Disposition: A | Discharge status: Home / Self Care | Payer: Medicaid Other | Source: Ambulatory Visit | Attending: Obstetrics and Gynecology | Admitting: Obstetrics and Gynecology

## 2021-04-05 ENCOUNTER — Ambulatory Visit: Payer: Medicaid Other

## 2021-04-05 ENCOUNTER — Other Ambulatory Visit: Payer: Self-pay

## 2021-04-05 ENCOUNTER — Other Ambulatory Visit: Payer: Medicaid Other

## 2021-04-05 ENCOUNTER — Ambulatory Visit (INDEPENDENT_AMBULATORY_CARE_PROVIDER_SITE_OTHER): Payer: Medicaid Other | Admitting: Obstetrics and Gynecology

## 2021-04-05 ENCOUNTER — Encounter: Payer: Self-pay | Admitting: Obstetrics and Gynecology

## 2021-04-05 VITALS — BP 112/68 | HR 89 | Wt 145.0 lb

## 2021-04-05 DIAGNOSIS — O021 Missed abortion: Secondary | ICD-10-CM

## 2021-04-05 DIAGNOSIS — O0990 Supervision of high risk pregnancy, unspecified, unspecified trimester: Secondary | ICD-10-CM | POA: Diagnosis not present

## 2021-04-05 DIAGNOSIS — O09522 Supervision of elderly multigravida, second trimester: Secondary | ICD-10-CM

## 2021-04-05 DIAGNOSIS — Z789 Other specified health status: Secondary | ICD-10-CM

## 2021-04-05 DIAGNOSIS — Z3009 Encounter for other general counseling and advice on contraception: Secondary | ICD-10-CM | POA: Insufficient documentation

## 2021-04-05 NOTE — Patient Instructions (Signed)
Tercer trimestre de Media planner Third Trimester of Pregnancy  El tercer trimestre de embarazo va desde la semana 28 hasta la semana 40. Tambin se dice que va desde el mes 7 hasta el mes 9. En este trimestre, el beb en gestacin (feto) crece muy rpidamente. Hacia el final del noveno mes, el beb en gestacin mide alrededor de 20 pulgadas (45 cm) de largo. Pesa entre 6 y 79 libras(2,70 y 4,50 kg). Cambios en el cuerpo durante el tercer trimestre Su organismo contina atravesando por muchos cambios durante este perodo. Los cambios varan y generalmente vuelven a la normalidad despus del nacimientodel beb. Cambios fsicos Seguir [employer]. Puede ser que aumente entre 25 y 59 libras (11 y 16 kg) hacia el final del Media planner. Si tiene [employer], puede aumentar entre 28 y 40 lb (unos 13 a 18 kg). Si tiene sobrepeso, puede aumentar entre 15 y 25 libras (unos 7 a 11 kg). Podrn aparecer las primeras estras en las caderas, el vientre (abdomen) y las [place]. [place] [employer] seguirn creciendo y Tourist information centre manager. Un lquido amarillo Public affairs consultant) puede salir de sus pechos. Esta es la primera leche que usted produce para el beb. Tal vez haya cambios en el cabello. El ombligo puede salir hacia afuera. Puede observar que se le hinchan ms las manos, la cara o los tobillos. Cambios en la salud Es posible que tenga acidez estomacal. Es posible que tenga dificultades para defecar (estreimiento). Pueden aparecerle hemorroides. Estas son venas hinchadas en el ano que pueden picar o doler. Puede comenzar a tener venas hinchadas (vrices) en las piernas. Puede presentar ms dolor en la pelvis, la espalda o los muslos. Puede presentar ms hormigueo o entumecimiento en las manos, los brazos y las piernas. La piel de su vientre tambin puede sentirse entumecida. Es posible que sienta falta de aire a medida que el tero se [place]. Otros cambios Es posible que haga pis (orine) con mayor frecuencia. Puede tener ms  problemas para dormir. Puede notar que el beb en gestacin "baja" o se mueve ms hacia bajo, en el vientre. Puede notar ms secrecin proveniente de la vagina. Puede sentir las articulaciones flojas y Agricultural consultant alrededor del hueso plvico. Siga estas instrucciones en su casa: Medicamentos Use los medicamentos de venta libre y los recetados solamente como se lo haya indicado el mdico. Algunos medicamentos no son seguros Solicitor. Tome vitaminas prenatales que contengan por lo menos 600 microgramos (mcg) de cido flico. Comida y bebida Consuma comidas saludables que incluyan lo siguiente: Lambert Mody y verduras frescas. Cereales integrales. Buenas fuentes de protenas, como carne, huevos y tofu. Productos lcteos con bajo contenido de [place]. Evite la carne cruda y el [place], la [place] y el queso sin Radio producer. Estos portan grmenes que pueden provocar dao tanto a usted como al beb. Tome 4 o 5 comidas pequeas en lugar de 3 comidas abundantes al da. Es posible que deba tomar medidas para prevenir o tratar los problemas para defecar: Electronics engineer suficiente lquido para Contractor pis (orina) de color amarillo plido. Come alimentos ricos en fibra. Entre ellos, frijoles, cereales integrales y frutas y verduras frescas. Limitar los alimentos con alto contenido de grasa y Location manager. Estos incluyen alimentos fritos o dulces. Actividad Haga ejercicios solamente como se lo haya indicado el mdico. Interrumpa la actividad fsica si comienza a tener clicos en el tero. Evite levantar pesos [employer]. No haga ejercicio si hace demasiado calor, hay demasiada humedad o se encuentra en un lugar de mucha altura (altitud  elevada). Si lo desea, puede continuar teniendo [employer], a menos que el mdico le indique lo contrario. Alivio del dolor y del malestar Haga pausas con frecuencia y descanse con las piernas levantadas (elevadas) si tiene calambres en las piernas o dolor en la parte  baja de la espalda. Dese baos de asiento con agua tibia para Best boy o las molestias causadas por las hemorroides. Use una crema para las hemorroides si el mdico la autoriza. Use un sostn que le brinde buen soporte si sus mamas estn sensibles. Si desarrolla venas hinchadas y abultadas en las piernas: Use medias de compresin segn las indicaciones de su mdico. Levante los pies durante 15 minutos, 3 o 4 veces por Training and development officer. Limite la sal en sus alimentos. Seguridad Hable con el mdico antes de Control and instrumentation engineer. No se d baos de inmersin en agua caliente, baos turcos ni saunas. Use el cinturn de seguridad en todo momento mientras vaya en auto. Hable con el mdico si alguien le est haciendo dao o gritando [place]. Preparacin para la llegada del beb Para prepararse para la llegada de su beb: Tome clases prenatales. Visite el hospital y recorra el rea de maternidad. Compre un asiento de seguridad [employer] atrs para llevar al beb en el automvil. Aprenda cmo instalarlo en el auto. Prepare la habitacin del beb. Saque todas las almohadas y los animales de peluche de la cuna del beb. Instrucciones generales Evite el contacto con las bandejas sanitarias de los gatos y la tierra que estos animales usan. Estos contienen grmenes que pueden daar al beb y causar la prdida del beb ya sea aborto espontneo o muerte fetal. No se haga duchas vaginales ni use tampones. No use tampones ni toallas higinicas perfumadas. No fume ni consuma ningn producto que contenga nicotina o tabaco. Si necesita ayuda para dejar de fumar, consulte al mdico. No beba alcohol. No use medicamentos a base de hierbas, drogas ilegales, ni medicamentos que el mdico no haya autorizado. Las sustancias qumicas de estos productos pueden afectar al beb. Cumpla con todas las visitas de seguimiento. Esto es importante. Dnde buscar ms informacin American Pregnancy Association (Asociacin  Americana del Embarazo): americanpregnancy.org [employer] of Obstetricians and Gynecologists (Colegio Estadounidense de Obstetras y Gineclogos): www.acog.org Office on Home [employer]ot ([place]): KeywordPortfolios.com.br Comunquese con un mdico si: Tiene fiebre. Tiene clicos leves o siente presin en la parte baja del vientre. Sufre un dolor persistente en el abdomen. Vomita o hace deposiciones acuosas (diarrea). Advierte lquido con mal olor que proviene de la vagina. Siente dolor al orinar o hace orina con mal olor. Tiene un dolor de cabeza que no desaparece despus de Teacher, adult education. Nota cambios en la visin o ve manchas delante de los ojos. Solicite ayuda de inmediato si: Rompe la bolsa. Tiene contracciones regulares separadas por menos de 5 minutos. Tiene sangrado o pequeas prdidas vaginales. Tiene clicos o dolor muy intensos en el vientre. Tiene dificultad para respirar. Sientes dolor en el pecho. Se desmaya. No ha sentido al beb moverse durante el tiempo que le indic el mdico. Tiene dolor, hinchazn o enrojecimiento nuevos en un brazo o una pierna o se produce un aumento de alguno de estos sntomas. Resumen El tercer trimestre comprende desde la semana 28 hasta la semana 40 (desde el mes 7 hasta el mes 9). Esta es la poca en que el beb en gestacin crece muy rpidamente. Durante este perodo, las molestias pueden aumentar a medida que usted sube de  peso y el beb crece. Preprese para la llegada del beb: asista a las clases prenatales, compre un asiento de seguridad orientado hacia atrs para llevar al beb en auto y prepare la habitacin del beb. Solicite ayuda de inmediato si tiene sangrado por la vagina, siente dolor en el pecho y tiene dificultad para respirar, o si no ha sentido al beb moverse durante el tiempo que le indic el mdico. Esta informacin no tiene Marine scientist el consejo del mdico. Asegresede hacerle al  mdico cualquier pregunta que tenga. Document Revised: 02/26/2020 Document Reviewed: 02/26/2020 Elsevier Patient Education  [place].

## 2021-04-05 NOTE — Addendum Note (Signed)
Addended byMariane Baumgarten on: 04/05/2021 03:12 PM   Modules accepted: Orders

## 2021-04-05 NOTE — Progress Notes (Signed)
Subjective:  Morgan Singleton is a 45 y.o. [id number] at 52w2dbeing seen today for ongoing prenatal care.  She is currently monitored for the following issues for this high-risk pregnancy and has FIBROCYSTIC BREAST DISEASE; IUFD at less than 20 weeks of gestation; Supervision of high risk pregnancy, antepartum; Language barrier; Multigravida of advanced maternal age in second trimester; Alpha thalassemia silent carrier; and Unwanted fertility on their problem list.  Patient reports general discomforts of pregnancy.  Contractions: Irritability. Vag. Bleeding: None.  Movement: Present. Denies leaking of fluid.   The following portions of the patient's history were reviewed and updated as appropriate: allergies, current medications, past family history, past medical history, past social history, past surgical history and problem list. Problem list updated.  Objective:   Vitals:   04/05/21 1433  BP: 112/68  Pulse: 89  Weight: 145 lb (65.8 kg)    Fetal Status: Fetal Heart Rate (bpm): 138   Movement: Present     General:  Alert, oriented and cooperative. Patient is in no acute distress.  Skin: Skin is warm and dry. No rash noted.   Cardiovascular: Normal heart rate noted  Respiratory: Normal respiratory effort, no problems with respiration noted  Abdomen: Soft, gravid, appropriate for gestational age. Pain/Pressure: Present     Pelvic:  Cervical exam performed        Extremities: Normal range of motion.  Edema: None  Mental Status: Normal mood and affect. Normal behavior. Normal judgment and thought content.   Urinalysis:      Assessment and Plan:  Pregnancy: [id number] 398w2d1. Supervision of high risk pregnancy, antepartum Stable Vaginal cultures today Labor precautions  2. Multigravida of advanced maternal age in second trimester Stable  3. IUFD at less than 20 weeks of gestation Missed U/S and antenatal testing appt this morning Will reschedule  4. Language  barrier Video interrupter used during today's visit  5. Unwanted fertility BTL papers signed  Preterm labor symptoms and general obstetric precautions including but not limited to vaginal bleeding, contractions, leaking of fluid and fetal movement were reviewed in detail with the patient. Please refer to After Visit Summary for other counseling recommendations.  Return in about 1 week (around 04/12/2021) for OB visit, face to face, MD only.   ErChancy MilroyMD

## 2021-04-06 LAB — GC/CHLAMYDIA PROBE AMP (~~LOC~~) NOT AT ARMC
Chlamydia: NEGATIVE
Comment: NEGATIVE
Comment: NORMAL
Neisseria Gonorrhea: NEGATIVE

## 2021-04-09 LAB — CULTURE, BETA STREP (GROUP B ONLY): Strep Gp B Culture: NEGATIVE

## 2021-04-12 ENCOUNTER — Ambulatory Visit (INDEPENDENT_AMBULATORY_CARE_PROVIDER_SITE_OTHER): Payer: Medicaid Other | Admitting: Obstetrics & Gynecology

## 2021-04-12 ENCOUNTER — Other Ambulatory Visit: Payer: Medicaid Other

## 2021-04-12 ENCOUNTER — Other Ambulatory Visit: Payer: Self-pay

## 2021-04-12 ENCOUNTER — Ambulatory Visit: Payer: Medicaid Other | Admitting: *Deleted

## 2021-04-12 ENCOUNTER — Ambulatory Visit: Payer: Medicaid Other | Attending: Obstetrics and Gynecology

## 2021-04-12 VITALS — BP 123/74 | HR 77

## 2021-04-12 VITALS — BP 122/85 | HR 75 | Wt 144.3 lb

## 2021-04-12 DIAGNOSIS — O0990 Supervision of high risk pregnancy, unspecified, unspecified trimester: Secondary | ICD-10-CM | POA: Insufficient documentation

## 2021-04-12 DIAGNOSIS — O09523 Supervision of elderly multigravida, third trimester: Secondary | ICD-10-CM | POA: Diagnosis not present

## 2021-04-12 DIAGNOSIS — Z362 Encounter for other antenatal screening follow-up: Secondary | ICD-10-CM

## 2021-04-12 DIAGNOSIS — Z3A37 37 weeks gestation of pregnancy: Secondary | ICD-10-CM | POA: Diagnosis not present

## 2021-04-12 DIAGNOSIS — Z148 Genetic carrier of other disease: Secondary | ICD-10-CM

## 2021-04-12 DIAGNOSIS — O09293 Supervision of pregnancy with other poor reproductive or obstetric history, third trimester: Secondary | ICD-10-CM | POA: Diagnosis not present

## 2021-04-12 DIAGNOSIS — O09522 Supervision of elderly multigravida, second trimester: Secondary | ICD-10-CM

## 2021-04-12 NOTE — Progress Notes (Signed)
Patient stated that she is feeling fine and has no questions or concerns

## 2021-04-12 NOTE — Progress Notes (Signed)
   PRENATAL VISIT NOTE  Subjective:  Morgan Singleton is a 45 y.o. [id number] at 58w2dbeing seen today for ongoing prenatal care.  She is currently monitored for the following issues for this high-risk pregnancy and has FIBROCYSTIC BREAST DISEASE; IUFD at less than 20 weeks of gestation; Supervision of high risk pregnancy, antepartum; Language barrier; Multigravida of advanced maternal age in second trimester; Alpha thalassemia silent carrier; and Unwanted fertility on their problem list.  Patient reports no complaints.  Contractions: Not present. Vag. Bleeding: None.  Movement: Present. Denies leaking of fluid.   The following portions of the patient's history were reviewed and updated as appropriate: allergies, current medications, past family history, past medical history, past social history, past surgical history and problem list.   Objective:   Vitals:   04/12/21 1452  BP: 122/85  Pulse: 75  Weight: 144 lb 4.8 oz (65.5 kg)    Fetal Status: Fetal Heart Rate (bpm): 153   Movement: Present     General:  Alert, oriented and cooperative. Patient is in no acute distress.  Skin: Skin is warm and dry. No rash noted.   Cardiovascular: Normal heart rate noted  Respiratory: Normal respiratory effort, no problems with respiration noted  Abdomen: Soft, gravid, appropriate for gestational age.  Pain/Pressure: Absent     Pelvic: Cervical exam deferred        Extremities: Normal range of motion.  Edema: None  Mental Status: Normal mood and affect. Normal behavior. Normal judgment and thought content.   Assessment and Plan:  Pregnancy: [id number] 371w2d. Supervision of high risk pregnancy, antepartum BPP 8/8  2. Multigravida of advanced maternal age in second trimester IOL [id number]  Term labor symptoms and general obstetric precautions including but not limited to vaginal bleeding, contractions, leaking of fluid and fetal movement were reviewed in detail with the patient. Please  refer to After Visit Summary for other counseling recommendations.   Return in about 1 week (around 04/19/2021).  Future Appointments  Date Time Provider DeRich[place]8/22/2022  8:15 AM [facility]8/22/2022 10:35 AM BaGriffin BasilMD [facility]  JaEmeterio ReeveMD

## 2021-04-17 ENCOUNTER — Other Ambulatory Visit: Payer: Self-pay | Admitting: Advanced Practice Midwife

## 2021-04-19 ENCOUNTER — Ambulatory Visit (INDEPENDENT_AMBULATORY_CARE_PROVIDER_SITE_OTHER): Payer: Medicaid Other

## 2021-04-19 ENCOUNTER — Other Ambulatory Visit (HOSPITAL_COMMUNITY): Payer: Self-pay | Admitting: Advanced Practice Midwife

## 2021-04-19 ENCOUNTER — Other Ambulatory Visit: Payer: Self-pay

## 2021-04-19 ENCOUNTER — Ambulatory Visit: Payer: Medicaid Other | Admitting: *Deleted

## 2021-04-19 ENCOUNTER — Telehealth (HOSPITAL_COMMUNITY): Payer: Self-pay | Admitting: *Deleted

## 2021-04-19 ENCOUNTER — Ambulatory Visit (INDEPENDENT_AMBULATORY_CARE_PROVIDER_SITE_OTHER): Payer: Medicaid Other | Admitting: Obstetrics and Gynecology

## 2021-04-19 VITALS — BP 122/78 | HR 84 | Wt 145.3 lb

## 2021-04-19 DIAGNOSIS — Z3009 Encounter for other general counseling and advice on contraception: Secondary | ICD-10-CM

## 2021-04-19 DIAGNOSIS — Z789 Other specified health status: Secondary | ICD-10-CM

## 2021-04-19 DIAGNOSIS — O09522 Supervision of elderly multigravida, second trimester: Secondary | ICD-10-CM

## 2021-04-19 DIAGNOSIS — Z3A38 38 weeks gestation of pregnancy: Secondary | ICD-10-CM | POA: Insufficient documentation

## 2021-04-19 DIAGNOSIS — O021 Missed abortion: Secondary | ICD-10-CM

## 2021-04-19 DIAGNOSIS — O0990 Supervision of high risk pregnancy, unspecified, unspecified trimester: Secondary | ICD-10-CM

## 2021-04-19 NOTE — Progress Notes (Signed)
   PRENATAL VISIT NOTE  Subjective:  Morgan Singleton is a 45 y.o. [id number] at 65w2dbeing seen today for ongoing prenatal care.  She is currently monitored for the following issues for this high-risk pregnancy and has FIBROCYSTIC BREAST DISEASE; IUFD at less than 20 weeks of gestation; Supervision of high risk pregnancy, antepartum; Language barrier; Multigravida of advanced maternal age in second trimester; Alpha thalassemia silent carrier; Unwanted fertility; and [redacted] weeks gestation of pregnancy on their problem list.  Patient doing well with no acute concerns today. She reports occasional contractions.  Contractions: Not present. Vag. Bleeding: None, Scant.  Movement: Present. Denies leaking of fluid.   The following portions of the patient's history were reviewed and updated as appropriate: allergies, current medications, past family history, past medical history, past social history, past surgical history and problem list. Problem list updated.  Objective:   Vitals:   04/19/21 0858  BP: 122/78  Pulse: 84  Weight: 145 lb 4.8 oz (65.9 kg)    Fetal Status: Fetal Heart Rate (bpm): NST Fundal Height: 37 cm Movement: Present     General:  Alert, oriented and cooperative. Patient is in no acute distress.  Skin: Skin is warm and dry. No rash noted.   Cardiovascular: Normal heart rate noted  Respiratory: Normal respiratory effort, no problems with respiration noted  Abdomen: Soft, gravid, appropriate for gestational age.  Pain/Pressure: Present     Pelvic: Cervical exam performed Dilation: 1 Effacement (%): 50 Station: -3  Extremities: Normal range of motion.  Edema: None  Mental Status:  Normal mood and affect. Normal behavior. Normal judgment and thought content.   Assessment and Plan:  Pregnancy: [id number] 382w2d1. Supervision of high risk pregnancy, antepartum Pt scheduled for IOL  2. Multigravida of advanced maternal age in second trimester   3. IUFD at less than 20  weeks of gestation BPP today 10/10, pt scheduled for IOL  4. Language barrier Interpreter present , questions answered  5. [redacted] weeks gestation of pregnancy   6. Unwanted fertility Pt has signed consent for BTL  Term labor symptoms and general obstetric precautions including but not limited to vaginal bleeding, contractions, leaking of fluid and fetal movement were reviewed in detail with the patient.  Please refer to After Visit Summary for other counseling recommendations.   No follow-ups on file.   LaLynnda ShieldsMD Faculty Attending Center for [facility]

## 2021-04-19 NOTE — Progress Notes (Signed)
IOL scheduled on 8/27

## 2021-04-19 NOTE — Telephone Encounter (Signed)
Preadmission screen  

## 2021-04-20 ENCOUNTER — Inpatient Hospital Stay (EMERGENCY_DEPARTMENT_HOSPITAL)
Admission: AD | Admit: 2021-04-20 | Discharge: 2021-04-20 | Disposition: A | Discharge status: Home / Self Care | Payer: Medicaid Other | Source: Home / Self Care | Attending: Obstetrics & Gynecology | Admitting: Obstetrics & Gynecology

## 2021-04-20 ENCOUNTER — Telehealth (HOSPITAL_COMMUNITY): Payer: Self-pay | Admitting: *Deleted

## 2021-04-20 ENCOUNTER — Encounter (HOSPITAL_COMMUNITY): Payer: Self-pay | Admitting: *Deleted

## 2021-04-20 ENCOUNTER — Other Ambulatory Visit: Payer: Self-pay

## 2021-04-20 ENCOUNTER — Encounter (HOSPITAL_COMMUNITY): Payer: Self-pay | Admitting: Obstetrics & Gynecology

## 2021-04-20 DIAGNOSIS — O471 False labor at or after 37 completed weeks of gestation: Secondary | ICD-10-CM | POA: Insufficient documentation

## 2021-04-20 DIAGNOSIS — O99891 Other specified diseases and conditions complicating pregnancy: Secondary | ICD-10-CM | POA: Diagnosis not present

## 2021-04-20 DIAGNOSIS — O0990 Supervision of high risk pregnancy, unspecified, unspecified trimester: Secondary | ICD-10-CM

## 2021-04-20 DIAGNOSIS — R030 Elevated blood-pressure reading, without diagnosis of hypertension: Secondary | ICD-10-CM | POA: Diagnosis not present

## 2021-04-20 DIAGNOSIS — O26893 Other specified pregnancy related conditions, third trimester: Secondary | ICD-10-CM | POA: Insufficient documentation

## 2021-04-20 DIAGNOSIS — Z3A38 38 weeks gestation of pregnancy: Secondary | ICD-10-CM

## 2021-04-20 DIAGNOSIS — O479 False labor, unspecified: Secondary | ICD-10-CM

## 2021-04-20 NOTE — Telephone Encounter (Signed)
Interpreter number [phone]

## 2021-04-20 NOTE — MAU Provider Note (Signed)
    S: [name] is a 45 y.o. [phone] at [redacted]w[redacted]d who presents to MAU today complaining of irreg contractions. She denies vaginal bleeding. She denies LOF. She reports normal fetal movement.    O: BP 118/74   Pulse 85   Temp 98.1 F (36.7 C) (Oral)   Resp 16   LMP 07/15/2020   SpO2 97%  GENERAL: Well-developed, well-nourished female in no acute distress.  HEAD: Normocephalic, atraumatic.  CHEST: Normal effort of breathing, regular heart rate ABDOMEN: Soft, nontender, gravid  Cervical exam:  Dilation: 2.5 Effacement (%): 50 Cervical Position: Posterior Station: Ballotable Presentation: Vertex Exam by:: n druebbisch rn   Fetal Monitoring: Cat 1 Baseline: 120-130 Variability: avg LTV Accelerations: + Decelerations: none Contractions: irreg 3-7 mins   A: SIUP at 326w3dFalse labor Single elevated BP with nl recheck  P: D/C home with labor/ROM/bldg precautions [facility] BP [facility]  ShMyrtis SerCNM 04/20/2021 4:04 PM

## 2021-04-20 NOTE — Telephone Encounter (Signed)
Add on from previous not.  When pt told based on her info she did not need to come to the hospital she stated her doctor told her the pregnancy was high risk and to come if anything happened.  Told the patient she was welcome to come to MAU.  MAU notified.

## 2021-04-20 NOTE — Telephone Encounter (Signed)
[phone] interpreter number  Pt states she lost her mucus plug this morning and having ucs q 15 minutes apart.  Denies ROM or bleeding.  Mucus was blood tinged only. Fetal movement present.

## 2021-04-20 NOTE — MAU Note (Signed)
Morgan Singleton is a 45 y.o. at 64w3dhere in MAU reporting: contractions that are every 15 minutes. States they were more painful earlier. No bleeding or LOF. +FM  Onset of complaint: today  Pain score: 4/10  Vitals:   04/20/21 1331  BP: 123/86  Pulse: 88  Resp: 16  Temp: 98.1 F (36.7 C)  SpO2: 98%     FHT: EFM applied in room  Lab orders placed from triage: none

## 2021-04-21 ENCOUNTER — Inpatient Hospital Stay (HOSPITAL_COMMUNITY): Payer: Medicaid Other | Admitting: Anesthesiology

## 2021-04-21 ENCOUNTER — Other Ambulatory Visit: Payer: Self-pay

## 2021-04-21 ENCOUNTER — Inpatient Hospital Stay (HOSPITAL_COMMUNITY)
Admission: AD | Admit: 2021-04-21 | Discharge: 2021-04-23 | DRG: 796 | Disposition: A | Discharge status: Home / Self Care | Payer: Medicaid Other | Attending: Obstetrics and Gynecology | Admitting: Obstetrics and Gynecology

## 2021-04-21 ENCOUNTER — Encounter (HOSPITAL_COMMUNITY): Payer: Self-pay | Admitting: Obstetrics & Gynecology

## 2021-04-21 DIAGNOSIS — Z3A38 38 weeks gestation of pregnancy: Secondary | ICD-10-CM

## 2021-04-21 DIAGNOSIS — D563 Thalassemia minor: Secondary | ICD-10-CM | POA: Diagnosis present

## 2021-04-21 DIAGNOSIS — Z302 Encounter for sterilization: Secondary | ICD-10-CM

## 2021-04-21 DIAGNOSIS — Z7982 Long term (current) use of aspirin: Secondary | ICD-10-CM

## 2021-04-21 DIAGNOSIS — R030 Elevated blood-pressure reading, without diagnosis of hypertension: Secondary | ICD-10-CM | POA: Diagnosis not present

## 2021-04-21 DIAGNOSIS — U071 COVID-19: Secondary | ICD-10-CM | POA: Diagnosis not present

## 2021-04-21 DIAGNOSIS — O09513 Supervision of elderly primigravida, third trimester: Secondary | ICD-10-CM | POA: Diagnosis present

## 2021-04-21 DIAGNOSIS — O9852 Other viral diseases complicating childbirth: Principal | ICD-10-CM | POA: Diagnosis present

## 2021-04-21 DIAGNOSIS — O0990 Supervision of high risk pregnancy, unspecified, unspecified trimester: Secondary | ICD-10-CM

## 2021-04-21 DIAGNOSIS — O26893 Other specified pregnancy related conditions, third trimester: Secondary | ICD-10-CM | POA: Diagnosis not present

## 2021-04-21 DIAGNOSIS — O4202 Full-term premature rupture of membranes, onset of labor within 24 hours of rupture: Secondary | ICD-10-CM | POA: Diagnosis not present

## 2021-04-21 LAB — HEPATITIS B SURFACE ANTIGEN: Hepatitis B Surface Ag: NONREACTIVE

## 2021-04-21 LAB — RESP PANEL BY RT-PCR (FLU A&B, COVID) ARPGX2
Influenza A by PCR: NEGATIVE
Influenza B by PCR: NEGATIVE
SARS Coronavirus 2 by RT PCR: POSITIVE — AB

## 2021-04-21 LAB — TYPE AND SCREEN
ABO/RH(D): O POS
Antibody Screen: NEGATIVE

## 2021-04-21 LAB — CBC
HCT: 42.8 % (ref 36.0–46.0)
Hemoglobin: 14.2 g/dL (ref 12.0–15.0)
MCH: 28.6 pg (ref 26.0–34.0)
MCHC: 33.2 g/dL (ref 30.0–36.0)
MCV: 86.1 fL (ref 80.0–100.0)
Platelets: 182 10*3/uL (ref 150–400)
RBC: 4.97 MIL/uL (ref 3.87–5.11)
RDW: 16 % — ABNORMAL HIGH (ref 11.5–15.5)
WBC: 8.5 10*3/uL (ref 4.0–10.5)
nRBC: 0 % (ref 0.0–0.2)

## 2021-04-21 LAB — RPR: RPR Ser Ql: NONREACTIVE

## 2021-04-21 MED ORDER — FENTANYL CITRATE (PF) 100 MCG/2ML IJ SOLN: 50.0000 ug | INTRAMUSCULAR | Status: DC | PRN

## 2021-04-21 MED ORDER — IBUPROFEN 600 MG PO TABS
600.0000 mg | ORAL_TABLET | Freq: Four times a day (QID) | ORAL | Status: DC
  Administered 2021-04-22 – 2021-04-23 (×6): 600 mg via ORAL
  Filled 2021-04-21 (×7): qty 1

## 2021-04-21 MED ORDER — OXYCODONE-ACETAMINOPHEN 5-325 MG PO TABS: 1.0000 | ORAL_TABLET | ORAL | Status: DC | PRN

## 2021-04-21 MED ORDER — PHENYLEPHRINE 40 MCG/ML (10ML) SYRINGE FOR IV PUSH (FOR BLOOD PRESSURE SUPPORT)
80.0000 ug | PREFILLED_SYRINGE | INTRAVENOUS | Status: DC | PRN
  Filled 2021-04-21: qty 10

## 2021-04-21 MED ORDER — EPHEDRINE 5 MG/ML INJ: 10.0000 mg | INTRAVENOUS | Status: DC | PRN

## 2021-04-21 MED ORDER — ACETAMINOPHEN 325 MG PO TABS: 650.0000 mg | ORAL_TABLET | ORAL | Status: DC | PRN

## 2021-04-21 MED ORDER — LACTATED RINGERS IV SOLN
500.0000 mL | Freq: Once | INTRAVENOUS | Status: AC
  Administered 2021-04-21: 500 mL via INTRAVENOUS

## 2021-04-21 MED ORDER — TETANUS-DIPHTH-ACELL PERTUSSIS 5-2.5-18.5 LF-MCG/0.5 IM SUSY: 0.5000 mL | PREFILLED_SYRINGE | Freq: Once | INTRAMUSCULAR | Status: DC

## 2021-04-21 MED ORDER — DIPHENHYDRAMINE HCL 50 MG/ML IJ SOLN
12.5000 mg | INTRAMUSCULAR | Status: DC | PRN
Start: 2021-04-21 — End: 2021-04-21

## 2021-04-21 MED ORDER — PHENYLEPHRINE 40 MCG/ML (10ML) SYRINGE FOR IV PUSH (FOR BLOOD PRESSURE SUPPORT)
80.0000 ug | PREFILLED_SYRINGE | INTRAVENOUS | Status: DC | PRN
Start: 2021-04-21 — End: 2021-04-21

## 2021-04-21 MED ORDER — OXYTOCIN-SODIUM CHLORIDE 30-0.9 UT/500ML-% IV SOLN
2.5000 [IU]/h | INTRAVENOUS | Status: DC
  Filled 2021-04-21: qty 500

## 2021-04-21 MED ORDER — ACETAMINOPHEN 325 MG PO TABS
650.0000 mg | ORAL_TABLET | ORAL | Status: DC | PRN
  Administered 2021-04-21 – 2021-04-23 (×3): 650 mg via ORAL
  Filled 2021-04-21 (×3): qty 2

## 2021-04-21 MED ORDER — LACTATED RINGERS IV SOLN: 500.0000 mL | INTRAVENOUS | Status: DC | PRN

## 2021-04-21 MED ORDER — WITCH HAZEL-GLYCERIN EX PADS: 1.0000 "application " | MEDICATED_PAD | CUTANEOUS | Status: DC | PRN

## 2021-04-21 MED ORDER — SOD CITRATE-CITRIC ACID 500-334 MG/5ML PO SOLN: 30.0000 mL | ORAL | Status: DC | PRN

## 2021-04-21 MED ORDER — EPHEDRINE 5 MG/ML INJ
10.0000 mg | INTRAVENOUS | Status: DC | PRN
Start: 2021-04-21 — End: 2021-04-21

## 2021-04-21 MED ORDER — SIMETHICONE 80 MG PO CHEW: 80.0000 mg | CHEWABLE_TABLET | ORAL | Status: DC | PRN

## 2021-04-21 MED ORDER — LACTATED RINGERS IV SOLN: INTRAVENOUS | Status: DC

## 2021-04-21 MED ORDER — COCONUT OIL OIL
1.0000 "application " | TOPICAL_OIL | Status: DC | PRN
  Administered 2021-04-21: 1 via TOPICAL

## 2021-04-21 MED ORDER — ZOLPIDEM TARTRATE 5 MG PO TABS: 5.0000 mg | ORAL_TABLET | Freq: Every evening | ORAL | Status: DC | PRN

## 2021-04-21 MED ORDER — FENTANYL-BUPIVACAINE-NACL 0.5-0.125-0.9 MG/250ML-% EP SOLN
EPIDURAL | Status: AC
  Filled 2021-04-21: qty 250

## 2021-04-21 MED ORDER — SENNOSIDES-DOCUSATE SODIUM 8.6-50 MG PO TABS
2.0000 | ORAL_TABLET | ORAL | Status: DC
  Administered 2021-04-23: 2 via ORAL
  Filled 2021-04-21: qty 2

## 2021-04-21 MED ORDER — PHENYLEPHRINE 40 MCG/ML (10ML) SYRINGE FOR IV PUSH (FOR BLOOD PRESSURE SUPPORT): 80.0000 ug | PREFILLED_SYRINGE | INTRAVENOUS | Status: DC | PRN

## 2021-04-21 MED ORDER — FENTANYL-BUPIVACAINE-NACL 0.5-0.125-0.9 MG/250ML-% EP SOLN
12.0000 mL/h | EPIDURAL | Status: DC | PRN
  Administered 2021-04-21: 12 mL/h via EPIDURAL

## 2021-04-21 MED ORDER — DIPHENHYDRAMINE HCL 25 MG PO CAPS: 25.0000 mg | ORAL_CAPSULE | Freq: Four times a day (QID) | ORAL | Status: DC | PRN

## 2021-04-21 MED ORDER — OXYCODONE-ACETAMINOPHEN 5-325 MG PO TABS: 2.0000 | ORAL_TABLET | ORAL | Status: DC | PRN

## 2021-04-21 MED ORDER — ONDANSETRON HCL 4 MG PO TABS: 4.0000 mg | ORAL_TABLET | ORAL | Status: DC | PRN

## 2021-04-21 MED ORDER — BENZOCAINE-MENTHOL 20-0.5 % EX AERO
1.0000 "application " | INHALATION_SPRAY | CUTANEOUS | Status: DC | PRN
  Administered 2021-04-21: 1 via TOPICAL
  Filled 2021-04-21: qty 56

## 2021-04-21 MED ORDER — ONDANSETRON HCL 4 MG/2ML IJ SOLN: 4.0000 mg | Freq: Four times a day (QID) | INTRAMUSCULAR | Status: DC | PRN

## 2021-04-21 MED ORDER — ONDANSETRON HCL 4 MG/2ML IJ SOLN: 4.0000 mg | INTRAMUSCULAR | Status: DC | PRN

## 2021-04-21 MED ORDER — OXYTOCIN BOLUS FROM INFUSION: 333.0000 mL | Freq: Once | INTRAVENOUS | Status: DC

## 2021-04-21 MED ORDER — LACTATED RINGERS IV SOLN: 500.0000 mL | Freq: Once | INTRAVENOUS | Status: DC

## 2021-04-21 MED ORDER — LIDOCAINE HCL (PF) 1 % IJ SOLN: 30.0000 mL | INTRAMUSCULAR | Status: DC | PRN

## 2021-04-21 MED ORDER — PRENATAL MULTIVITAMIN CH
1.0000 | ORAL_TABLET | Freq: Every day | ORAL | Status: DC
  Administered 2021-04-23: 1 via ORAL
  Filled 2021-04-21: qty 1

## 2021-04-21 MED ORDER — FENTANYL-BUPIVACAINE-NACL 0.5-0.125-0.9 MG/250ML-% EP SOLN: 12.0000 mL/h | EPIDURAL | Status: DC | PRN

## 2021-04-21 MED ORDER — DIBUCAINE (PERIANAL) 1 % EX OINT: 1.0000 "application " | TOPICAL_OINTMENT | CUTANEOUS | Status: DC | PRN

## 2021-04-21 MED ORDER — LIDOCAINE-EPINEPHRINE (PF) 2 %-1:200000 IJ SOLN
INTRAMUSCULAR | Status: DC | PRN
  Administered 2021-04-21: 5 mL via EPIDURAL

## 2021-04-21 MED ORDER — TERBUTALINE SULFATE 1 MG/ML IJ SOLN
INTRAMUSCULAR | Status: AC
  Filled 2021-04-21: qty 1

## 2021-04-21 NOTE — Anesthesia Procedure Notes (Signed)
Epidural Patient location during procedure: OB Start time: 04/21/2021 10:09 AM End time: 04/21/2021 10:15 AM  Staffing Anesthesiologist: Effie Berkshire, MD Performed: anesthesiologist   Preanesthetic Checklist Completed: patient identified, IV checked, site marked, risks and benefits discussed, surgical consent, monitors and equipment checked, pre-op evaluation and timeout performed  Epidural Patient position: sitting Prep: DuraPrep Patient monitoring: heart rate, continuous pulse ox and blood pressure Approach: midline Location: L3-L4 Injection technique: LOR saline  Needle:  Needle type: Tuohy  Needle gauge: 17 G Needle length: 9 cm Catheter type: closed end flexible Catheter size: 20 Guage Test dose: negative and 1.5% lidocaine  Assessment Events: blood not aspirated, injection not painful, no injection resistance and no paresthesia  Additional Notes LOR @ 4  Patient identified. Risks/Benefits/Options discussed with patient including but not limited to bleeding, infection, nerve damage, paralysis, failed block, incomplete pain control, headache, blood pressure changes, nausea, vomiting, reactions to medications, itching and postpartum back pain. Confirmed with bedside nurse the patient's most recent platelet count. Confirmed with patient that they are not currently taking any anticoagulation, have any bleeding history or any family history of bleeding disorders. Patient expressed understanding and wished to proceed. All questions were answered. Sterile technique was used throughout the entire procedure. Please see nursing notes for vital signs. Test dose was given through epidural catheter and negative prior to continuing to dose epidural or start infusion. Warning signs of high block given to the patient including shortness of breath, tingling/numbness in hands, complete motor block, or any concerning symptoms with instructions to call for help. Patient was given instructions on  fall risk and not to get out of bed. All questions and concerns addressed with instructions to call with any issues or inadequate analgesia.    Reason for block:procedure for pain

## 2021-04-21 NOTE — H&P (Addendum)
OBSTETRIC ADMISSION HISTORY AND PHYSICAL  Jolleen Folmer is a 45 y.o. female [phone] with IUP at 32w4dby early [place]ore[place]ing for SOL. She reports +FMs, +LOF, no VB, no blurry vision, headaches or peripheral edema, and RUQ pain. She reports a large gush of fluid around 7 AM today. She plans on breast and bottle feeding. She requests BTL for birth control. [facility]  Dating: By 8wk [place]--->  Estimated Date of Delivery: 05/01/21  Sono:    '@[redacted]w[redacted]d'$ , CWD, normal anatomy,  transverse presentation, anterior previa placental lie, 294g, 47% EFW   Prenatal History/Complications:  Anterior previa> resolved 02/08/21 AMA H/o 19wk IUFD Spanish speaking Needs PP pap smear Alpha thalassemia silent carrier Fibrocystic breast disease  Past Medical History: Past Medical History:  Diagnosis Date   History of ectopic pregnancy    No pertinent past medical history    THYROMEGALY 06/08/2006   Annotation: TSH was normal in 2007 Qualifier: Diagnosis of  By: MAmil AmenMD, Elizabeth     Vaginal Pap smear, abnormal     Past Surgical History: Past Surgical History:  Procedure Laterality Date   LAPAROSCOPY  04/26/2012   Procedure: LAPAROSCOPY OPERATIVE;  Surgeon: PMora Bellman MD;  [place];  Service: Gynecology;  Laterality: N/A;  Left Salpingectomy    Obstetrical History: OB History     Gravida  7   Para  3   Term  3   Preterm      AB  3   Living  3      SAB  2   IAB  0   Ectopic  1   Multiple  0   Live Births  3           Social History Social History   Socioeconomic History   Marital status: Married    Spouse name: Not on file   Number of children: Not on file   Years of education: Not on file   Highest education level: Not on file  Occupational History   Not on file  Tobacco Use   Smoking status: Never   Smokeless tobacco: Never  Vaping Use   Vaping Use: Never used  Substance and  Sexual Activity   Alcohol use: Not Currently    Comment: occassionally   Drug use: No   Sexual activity: Yes    Birth control/protection: None  Other Topics Concern   Not on file  Social History Narrative   Not on file   Social Determinants of Health   Financial Resource Strain: Not on file  Food Insecurity: No Food Insecurity   Worried About Running Out of Food in the Last Year: Never true   [place] the Last Year: Never true  Transportation Needs: No Transportation Needs   Lack of Transportation (Medical): No   Lack of Transportation (Non-Medical): No  Physical Activity: Not on file  Stress: Not on file  Social Connections: Not on file    Family History: Family History  Problem Relation Age of Onset   Hypertension Father    Diabetes Maternal Aunt    Diabetes Paternal Grandfather    Hypertension Mother     Allergies: No Known Allergies  Medications Prior to Admission  Medication Sig Dispense Refill Last Dose   aspirin EC 81 MG tablet Take 1 tablet (81 mg total) by mouth daily. 60 tablet 2    Blood Pressure Monitoring DEVI 1 each by Does  not apply route once a week. 1 each 0    cetirizine (ZYRTEC ALLERGY) 10 MG tablet Take 1 tablet (10 mg total) by mouth daily for 10 days. 14 tablet 0    prenatal vitamin w/FE, FA (PRENATAL 1 + 1) 27-1 MG TABS tablet Take 1 tablet by mouth daily at 12 noon.        Review of Systems   All systems reviewed and negative except as stated in HPI  Blood pressure 136/86, pulse 81, temperature 97.8 F (36.6 C), temperature source Oral, resp. rate 15, last menstrual period 07/15/2020, SpO2 97 %. General appearance: alert, cooperative, appears stated age, and no distress Lungs: clear to auscultation bilaterally Heart: regular rate and rhythm Abdomen: soft, non-tender; bowel sounds normal Pelvic: performed by MAU provider, CVE below Extremities: Homans sign is negative, no sign of DVT DTR's 2+ Presentation: cephalic Fetal  monitoringBaseline: 130 bpm, Variability: Good {> 6 bpm), Accelerations: Reactive, and Decelerations: Variable: x1 Uterine activityFrequency: Every 3 minutes Dilation: 6 Effacement (%): 80 Station: 0 Exam by:: [employer] RN   Prenatal labs: ABO, Rh:  O+ Antibody:  Neg Rubella:  Immune RPR: Non Reactive (06/09 0845)  HBsAg:    HIV: Non Reactive (06/09 0845)  GBS: Negative/-- (08/08 1537)  1 hr Glucola WNL Genetic screening  LR NIPS female Anatomy US Normal anatomy  Prenatal Transfer Tool  Maternal Diabetes: No Genetic Screening: Normal Maternal Ultrasounds/Referrals: Other: HC, FL at 6th and 3rd, AC 99th percentiles Fetal Ultrasounds or other Referrals:  None, Referred to [facility]  AMA and h/o IUFD  Maternal Substance Abuse:  No Significant Maternal Medications:  None Significant Maternal Lab Results: Group B Strep negative  No results found for this or any previous visit (from the past 24 hour(s)).  Patient Active Problem List   Diagnosis Date Noted   AMA (advanced maternal age) primigravida 56+, third trimester 04/21/2021   [redacted] weeks gestation of pregnancy 04/19/2021   Unwanted fertility 04/05/2021   Alpha thalassemia silent carrier 12/03/2020   Multigravida of advanced maternal age in second trimester 11/16/2020   Supervision of high risk pregnancy, antepartum 11/09/2020   Language barrier 11/09/2020   IUFD at less than 20 weeks of gestation 05/20/2019   FIBROCYSTIC BREAST DISEASE 10/29/2009    Assessment/Plan:  Annaleece Gaumond is a 45 y.o. [id number] at 24w4dhere for SOL.  #Labor: Patient had SROM around 7 AM, presented with SOL. Expectant management for now, patient progressing. Will add pitocin if ctx space out.  #AMA: BPP 10/10 on 8/22. LR NIPS.  #Fetal growth: On f/u anatomy scan on 04/12/21, HC and FL were 6th and 3rd percentiles respectively, overall growth is 65th percentile and AC is 99th percentile. LR  NIPS.  #Pain: epidural #FWB: Cat II with 1 variable, given bolus, will continue to monitor #ID:  GBS neg #MOF: Breast and bottle #MOC: BTL- papers signed 03/22/21 #Circ:  N/a  CGladys Damme MD  04/21/2021, 8:57 AM  Midwife attestation: I have seen and examined this patient; I agree with above documentation in the resident's note.   GAlanyaRamirezPacheco is a 45y.o. [id number] for SOL/SROM  PE: BP (!) 98/57   Pulse 84   Temp 98.3 F (36.8 C) (Oral)   Resp 18   LMP 07/15/2020   SpO2 99%  Gen: calm comfortable, NAD Resp: normal effort, no distress Abd: gravid  ROS, labs, PMH reviewed  Plan: Admit to LD Labor: expectant management Fetal monitoring: Overall Category I with isolated variable  ID: GBS negative.  COVID pending  Fatima Blank, CNM  04/21/2021, 10:49 AM

## 2021-04-21 NOTE — Progress Notes (Signed)
Morgan Singleton is a 45 y.o. [id number] at 74w4dadmitted for active labor  Subjective: Patient more comfortable with epidural  Objective: BP 97/60   Pulse 66   Temp 98.3 F (36.8 C) (Oral)   Resp 18   LMP 07/15/2020   SpO2 100%  No intake/output data recorded. No intake/output data recorded.  FHT:  FHR: 125 bpm, variability: moderate,  accelerations:  Present,  decelerations:  Present variables UC:   regular, every 3 minutes SVE:   Dilation: Lip/rim Effacement (%): 100 Station: 0 Exam by:: Dr. MChauncey Reading Labs: Lab Results  Component Value Date   WBC 8.5 04/21/2021   HGB 14.2 04/21/2021   HCT 42.8 04/21/2021   MCV 86.1 04/21/2021   PLT 182 04/21/2021    Assessment / Plan: Spontaneous labor, progressing normally  Labor:  Progressing well. Will recheck for complete in approx 1 hour and plan to push at that time. Preeclampsia:  n/a Fetal Wellbeing:  Category II Pain Control:  Epidural I/D:   GBS neg Anticipated MOD:  NSVD  CGladys Damme8/24/2022, 11:25 AM

## 2021-04-21 NOTE — Plan of Care (Signed)
completed

## 2021-04-21 NOTE — MAU Note (Signed)
.  Morgan Singleton is a 45 y.o. at 74w4dhere in MAU reporting: SROM at 0700 clear fluid. Reports bloody show. Ctx 5 min apart. GBS neg.   Vitals:   04/21/21 0845  BP: 136/86  Pulse: 81  Resp: 15  Temp: 97.8 F (36.6 C)  SpO2: 97%

## 2021-04-21 NOTE — Discharge Summary (Signed)
Postpartum Discharge Summary      Patient Name: Morgan Singleton DOB: [birth year] MRN: [id number]  Date of admission: 04/21/2021 Delivery date:04/21/2021  Delivering provider: Fatima Blank A  Date of discharge: 04/23/2021  Admitting diagnosis: AMA (advanced maternal age) primigravida 30+, third trimester [O09.513] Intrauterine pregnancy: [redacted]w[redacted]d    Secondary diagnosis:  Active Problems:   AMA (advanced maternal age) primigravida 340+ third trimester  Additional problems: Covid on admission (asymptomatic)    Discharge diagnosis: Term Pregnancy Delivered                                              Post partum procedures:postpartum tubal ligation Augmentation: N/A Complications: None  Hospital course: Onset of Labor With Vaginal Delivery      45y.o. yo [id number] 39w4das admitted in Active Labor on 04/21/2021. Patient had an uncomplicated labor course as follows:  Membrane Rupture Time/Date: 7:00 AM ,04/21/2021   Delivery Method:Vaginal, Spontaneous  Episiotomy: None  Lacerations:  1st degree;Perineal  Patient had an uncomplicated postpartum course.  She is ambulating, tolerating a regular diet, passing flatus, and urinating well. Patient is discharged home in stable condition on 04/23/21.  Newborn Data: Birth date:04/21/2021  Birth time:1:24 PM  Gender:Female  Living status:Living  Apgars:8 ,9  Weight:3325 g   Magnesium Sulfate received: No BMZ received: No Rhophylac:No MMR:No T-DaP:Given prenatally Flu: Yes Transfusion:No  Physical exam  Vitals:   04/22/21 1742 04/22/21 2115 04/23/21 0125 04/23/21 0530  BP: 116/71 119/87 111/75 105/67  Pulse:  87 78 71  Resp: _0 Temp: 98.6 F (37 C) 99.1 F (37.3 C) 99.5 F (37.5 C) 98.7 F (37.1 C)  TempSrc: Oral Oral Oral Oral  SpO2: 98% 99% 99% 99%  Weight:      Height:       General: alert, cooperative, and no distress Lochia: appropriate Uterine Fundus: firm Incision: Healing well with no  significant drainage, No significant erythema, Dressing is clean, dry, and intact DVT Evaluation: No evidence of DVT seen on physical exam. Negative Homan's sign. No cords or calf tenderness. Labs: Lab Results  Component Value Date   WBC 8.5 04/21/2021   HGB 14.2 04/21/2021   HCT 42.8 04/21/2021   MCV 86.1 04/21/2021   PLT 182 04/21/2021   CMP Latest Ref Rng & Units 11/09/2020  Glucose 65 - 99 mg/dL 75  BUN 6 - 24 mg/dL 5(L)  Creatinine 0.57 - 1.00 mg/dL 0.48(L)  Sodium 134 - 144 mmol/L 139  Potassium 3.5 - 5.2 mmol/L 3.9  Chloride 96 - 106 mmol/L 104  CO2 20 - 29 mmol/L 19(L)  Calcium 8.7 - 10.2 mg/dL 9.2  Total Protein 6.0 - 8.5 g/dL 6.6  Total Bilirubin 0.0 - 1.2 mg/dL <0.2  Alkaline Phos 44 - 121 IU/L 82  AST 0 - 40 IU/L 14  ALT 0 - 32 IU/L 12   Edinburgh Score: Edinburgh Postnatal Depression Scale Screening Tool 04/22/2021  I have been able to laugh and see the funny side of things. 0  I have looked forward with enjoyment to things. 0  I have blamed myself unnecessarily when things went wrong. 1  I have been anxious or worried for no good reason. 2  I have felt scared or panicky for no good reason. 0  Things have been getting on top of me. 0  I have been so unhappy that I have had difficulty sleeping. 0  I have felt sad or miserable. 1  I have been so unhappy that I have been crying. 0  The thought of harming myself has occurred to me. 0  Edinburgh Postnatal Depression Scale Total 4     After visit meds:  Allergies as of 04/23/2021   No Known Allergies      Medication List     STOP taking these medications    aspirin EC 81 MG tablet   Blood Pressure Monitoring Devi   cetirizine 10 MG tablet Commonly known as: ZyrTEC Allergy       TAKE these medications    ibuprofen 600 MG tablet Commonly known as: ADVIL Take 1 tablet (600 mg total) by mouth every 6 (six) hours.   oxyCODONE-acetaminophen 5-325 MG tablet Commonly known as: PERCOCET/ROXICET Take  1-2 tablets by mouth every 6 (six) hours as needed for moderate pain or severe pain.   prenatal vitamin w/FE, FA 27-1 MG Tabs tablet Take 1 tablet by mouth daily at 12 noon.         Discharge home in stable condition Infant Feeding: Bottle and Breast Infant Disposition:home with mother Discharge instruction: per After Visit Summary and Postpartum booklet. Activity: Advance as tolerated. Pelvic rest for 6 weeks.  Diet: routine diet Future Appointments: Future Appointments  Date Time Provider [place]  04/27/2021 10:00 AM [facility] NURSE [facility]  05/31/2021  9:15 AM Radene Gunning, MD [facility]   Follow up Visit:  [place] [facility] [place] [facility] Follow up on 05/31/2021.   Specialty: Obstetrics and Gynecology Why: for postpartum check up Contact information: [place] [id number] [phone]                Message sent to [facility] on 04/21/21:  Please schedule this patient for a In person postpartum visit in 6 weeks with the following provider: Any provider. Additional Postpartum F/U: none   High risk pregnancy complicated by:  AMA Delivery mode:  Vaginal, Spontaneous  Anticipated Birth Control:  BTL done [facility]   04/23/2021 Christin Fudge, CNM

## 2021-04-21 NOTE — Anesthesia Preprocedure Evaluation (Addendum)
Anesthesia Evaluation  Patient identified by MRN, date of birth, ID band Patient awake    Reviewed: Allergy & Precautions, NPO status , Patient's Chart, lab work & pertinent test results  Airway Mallampati: I       Dental  (+) Teeth Intact   Pulmonary neg pulmonary ROS,    Pulmonary exam normal        Cardiovascular negative cardio ROS   Rhythm:Regular Rate:Normal     Neuro/Psych negative neurological ROS  negative psych ROS   GI/Hepatic   Endo/Other    Renal/GU      Musculoskeletal   Abdominal Normal abdominal exam  (+)   Peds  Hematology   Anesthesia Other Findings   Reproductive/Obstetrics (+) Pregnancy                            Anesthesia Physical Anesthesia Plan  ASA: 2  Anesthesia Plan: Epidural   Post-op Pain Management:    Induction:   PONV Risk Score and Plan: 0  Airway Management Planned: Natural Airway  Additional Equipment: None  Intra-op Plan:   Post-operative Plan:   Informed Consent: I have reviewed the patients History and Physical, chart, labs and discussed the procedure including the risks, benefits and alternatives for the proposed anesthesia with the patient or authorized representative who has indicated his/her understanding and acceptance.     Interpreter used for [employer] Discussed with:   Anesthesia Plan Comments: (Lab Results      Component                Value               Date                      WBC                      8.5                 04/21/2021                HGB                      14.2                04/21/2021                HCT                      42.8                04/21/2021                MCV                      86.1                04/21/2021                PLT                      182                 04/21/2021           )      Anesthesia Quick Evaluation

## 2021-04-22 ENCOUNTER — Encounter (HOSPITAL_COMMUNITY): Payer: Self-pay | Admitting: Obstetrics and Gynecology

## 2021-04-22 ENCOUNTER — Inpatient Hospital Stay (HOSPITAL_COMMUNITY): Payer: Medicaid Other | Admitting: Anesthesiology

## 2021-04-22 ENCOUNTER — Encounter (HOSPITAL_COMMUNITY)
Admission: AD | Disposition: A | Discharge status: Home / Self Care | Payer: Self-pay | Source: Home / Self Care | Attending: Obstetrics and Gynecology

## 2021-04-22 DIAGNOSIS — U071 COVID-19: Secondary | ICD-10-CM | POA: Diagnosis not present

## 2021-04-22 DIAGNOSIS — O9852 Other viral diseases complicating childbirth: Secondary | ICD-10-CM | POA: Diagnosis not present

## 2021-04-22 DIAGNOSIS — R030 Elevated blood-pressure reading, without diagnosis of hypertension: Secondary | ICD-10-CM | POA: Diagnosis not present

## 2021-04-22 DIAGNOSIS — Z3A38 38 weeks gestation of pregnancy: Secondary | ICD-10-CM | POA: Diagnosis not present

## 2021-04-22 DIAGNOSIS — Z7982 Long term (current) use of aspirin: Secondary | ICD-10-CM | POA: Diagnosis not present

## 2021-04-22 DIAGNOSIS — Z302 Encounter for sterilization: Secondary | ICD-10-CM

## 2021-04-22 DIAGNOSIS — D563 Thalassemia minor: Secondary | ICD-10-CM | POA: Diagnosis not present

## 2021-04-22 HISTORY — PX: TUBAL LIGATION: SHX77

## 2021-04-22 SURGERY — LIGATION, FALLOPIAN TUBE, POSTPARTUM
Anesthesia: Epidural | Laterality: Bilateral

## 2021-04-22 MED ORDER — LACTATED RINGERS IV SOLN: INTRAVENOUS | Status: DC | PRN

## 2021-04-22 MED ORDER — FENTANYL CITRATE (PF) 100 MCG/2ML IJ SOLN
INTRAMUSCULAR | Status: DC | PRN
  Administered 2021-04-22: 100 ug via INTRAVENOUS

## 2021-04-22 MED ORDER — ACETAMINOPHEN 160 MG/5ML PO SOLN: 1000.0000 mg | Freq: Once | ORAL | Status: DC

## 2021-04-22 MED ORDER — PROMETHAZINE HCL 25 MG/ML IJ SOLN
6.2500 mg | INTRAMUSCULAR | Status: DC | PRN
Start: 2021-04-22 — End: 2021-04-22

## 2021-04-22 MED ORDER — SODIUM BICARBONATE 8.4 % IV SOLN
INTRAVENOUS | Status: DC | PRN
  Administered 2021-04-22 (×2): 5 mL via EPIDURAL
  Administered 2021-04-22: 1 mL via EPIDURAL

## 2021-04-22 MED ORDER — KETOROLAC TROMETHAMINE 30 MG/ML IJ SOLN
INTRAMUSCULAR | Status: DC | PRN
Start: 2021-04-22 — End: 2021-04-22
  Administered 2021-04-22: 30 mg via INTRAVENOUS

## 2021-04-22 MED ORDER — METOCLOPRAMIDE HCL 10 MG PO TABS
10.0000 mg | ORAL_TABLET | Freq: Once | ORAL | Status: AC
  Administered 2021-04-22: 10 mg via ORAL
  Filled 2021-04-22: qty 1

## 2021-04-22 MED ORDER — BUPIVACAINE HCL (PF) 0.25 % IJ SOLN
INTRAMUSCULAR | Status: DC | PRN
  Administered 2021-04-22: 10 mL

## 2021-04-22 MED ORDER — SODIUM BICARBONATE 8.4 % IV SOLN
INTRAVENOUS | Status: AC
  Filled 2021-04-22: qty 50

## 2021-04-22 MED ORDER — OXYCODONE-ACETAMINOPHEN 5-325 MG PO TABS: 1.0000 | ORAL_TABLET | Freq: Four times a day (QID) | ORAL | Status: DC | PRN

## 2021-04-22 MED ORDER — SODIUM CHLORIDE 0.9 % IR SOLN
Status: DC | PRN
  Administered 2021-04-22: 1

## 2021-04-22 MED ORDER — ACETAMINOPHEN 500 MG PO TABS: 1000.0000 mg | ORAL_TABLET | Freq: Once | ORAL | Status: DC

## 2021-04-22 MED ORDER — LACTATED RINGERS IV SOLN: INTRAVENOUS | Status: DC

## 2021-04-22 MED ORDER — FENTANYL CITRATE (PF) 100 MCG/2ML IJ SOLN
INTRAMUSCULAR | Status: AC
  Filled 2021-04-22: qty 2

## 2021-04-22 MED ORDER — FENTANYL CITRATE (PF) 100 MCG/2ML IJ SOLN: 25.0000 ug | INTRAMUSCULAR | Status: DC | PRN

## 2021-04-22 MED ORDER — KETOROLAC TROMETHAMINE 30 MG/ML IJ SOLN
INTRAMUSCULAR | Status: AC
  Filled 2021-04-22: qty 1

## 2021-04-22 MED ORDER — BUPIVACAINE HCL (PF) 0.25 % IJ SOLN
INTRAMUSCULAR | Status: AC
  Filled 2021-04-22: qty 10

## 2021-04-22 MED ORDER — FAMOTIDINE 20 MG PO TABS
40.0000 mg | ORAL_TABLET | Freq: Once | ORAL | Status: AC
  Administered 2021-04-22: 40 mg via ORAL
  Filled 2021-04-22: qty 2

## 2021-04-22 SURGICAL SUPPLY — 21 items
CLOTH BEACON ORANGE TIMEOUT ST (SAFETY) ×2 IMPLANT
DRSG OPSITE POSTOP 3X4 (GAUZE/BANDAGES/DRESSINGS) ×2 IMPLANT
DURAPREP 26ML APPLICATOR (WOUND CARE) ×2 IMPLANT
GLOVE BIO SURGEON STRL SZ7.5 (GLOVE) ×2 IMPLANT
GLOVE BIOGEL PI IND STRL 7.0 (GLOVE) ×2 IMPLANT
GLOVE BIOGEL PI INDICATOR 7.0 (GLOVE) ×2
GOWN STRL REUS W/TWL LRG LVL3 (GOWN DISPOSABLE) ×2 IMPLANT
GOWN STRL REUS W/TWL XL LVL3 (GOWN DISPOSABLE) ×2 IMPLANT
NEEDLE HYPO 22GX1.5 SAFETY (NEEDLE) ×2 IMPLANT
NS IRRIG 1000ML POUR BTL (IV SOLUTION) ×2 IMPLANT
PACK ABDOMINAL MINOR (CUSTOM PROCEDURE TRAY) ×2 IMPLANT
PROTECTOR NERVE ULNAR (MISCELLANEOUS) ×2 IMPLANT
SPONGE LAP 4X18 RFD (DISPOSABLE) IMPLANT
SUT MNCRL AB 4-0 PS2 18 (SUTURE) ×2 IMPLANT
SUT PLAIN 0 NONE (SUTURE) ×2 IMPLANT
SUT VIC AB 0 CT1 27 (SUTURE) ×2
SUT VIC AB 0 CT1 27XBRD ANBCTR (SUTURE) ×1 IMPLANT
SYR CONTROL 10ML LL (SYRINGE) ×2 IMPLANT
TOWEL OR 17X24 6PK STRL BLUE (TOWEL DISPOSABLE) ×4 IMPLANT
TRAY FOLEY CATH SILVER 14FR (SET/KITS/TRAYS/PACK) ×2 IMPLANT
WATER STERILE IRR 1000ML POUR (IV SOLUTION) ×2 IMPLANT

## 2021-04-22 NOTE — Progress Notes (Signed)
Post Partum Day 1 Subjective: no complaints  Objective: Blood pressure 116/76, pulse 87, temperature 98.3 F (36.8 C), temperature source Oral, resp. rate 16, height '4\' 11"'$  (1.499 m), weight 65.9 kg, last menstrual period 07/15/2020, SpO2 100 %, unknown if currently breastfeeding.  Physical Exam:  General: alert Lochia: appropriate Uterine Fundus: firm Incision: healing well DVT Evaluation: No evidence of DVT seen on physical exam.  Recent Labs    04/21/21 0904  HGB 14.2  HCT 42.8    Assessment/Plan: Continue with progressive care. For BTL today  Patient desires bilateral tubal sterilization.  Other reversible forms of contraception were discussed with patient; she declines all other modalities. Discussed bilateral tubal sterilization in detail; discussed options of bilateral tubal sterilization using Filshie clips vs  bilateral salpingectomy. Risks and benefits discussed in detail including but not limited to: risk of regret, permanence of method, bleeding, infection, injury to surrounding organs and need for additional procedures.  Failure risk of 1-2 % for Filshie clips and <1% for bilateral salpingectomy with increased risk of ectopic gestation if pregnancy occurs was also discussed with patient.  Also discussed possible reduction of risk of ovarian cancer via bilateral salpingectomy given that a growing body of knowledge reveals that the majority of cases of high grade serous "ovarian" cancer actually are actually  cancers arising from the fimbriated end of the fallopian tubes. Emphasized that removal of fallopian tubes do not result in any known hormonal imbalance.  Patient verbalized understanding of these risks and benefits and wants to proceed with sterilization via salpingectomy. Pt has H/O Left salpingectomy in the past due to ectopic. .    To OR when ready  Video interrupter used during today's visit    LOS: 1 day   Chancy Milroy 04/22/2021, 10:11 AM

## 2021-04-22 NOTE — Op Note (Signed)
Morgan Singleton 04/22/2021  PREOPERATIVE DIAGNOSES: Multiparity, undesired fertility  POSTOPERATIVE DIAGNOSES: Multiparity, undesired fertility  PROCEDURE:  Postpartum Unilateral Tubal Sterilization   SURGEON: Dr. Legrand Como L. Theodor Mustin  ANESTHESIA:  Epidural and local analgesia using 20 ml of [id number] Marcaine  COMPLICATIONS:  None immediate.  ESTIMATED BLOOD LOSS: 1 ml.  FLUIDS: 69629 ml LR.  URINE OUTPUT:  150 ml of clear urine.  INDICATIONS:  45 y.o. [id number] with undesired fertility,status post vaginal delivery, desires permanent sterilization.  Other reversible forms of contraception were discussed with patient; she declines all other modalities. Risks of procedure discussed with patient including but not limited to: risk of regret, permanence of method, bleeding, infection, injury to surrounding organs and need for additional procedures.  Failure risk of 1 -2 % with increased risk of ectopic gestation if pregnancy occurs was also discussed with patient.      FINDINGS:  Normal uterus,  absent left tube ( history of salpingectomy), nl right tube and ovaries.  PROCEDURE DETAILS: The patient was taken to the operating room where her epidural anesthesia was dosed up to surgical level and found to be adequate.  She was then placed in the dorsal supine position and prepped and draped in sterile fashion.  After an adequate timeout was performed, attention was turned to the patient's abdomen where a small transverse skin incision was made under the umbilical fold. The incision was taken down to the layer of fascia using the scalpel, and fascia was incised, and extended bilaterally using Mayo scissors. The peritoneum was entered in a sharp fashion. Attention was then turned to the patient's uterus, and right fallopian tube was identified and followed out to the fimbriated end.  The mesosalpinx was cross clamped including the fimbriae, cut and double ligated with a free tie. Inspection of the left  side revealed no left tube.   Good hemostasis was noted overall.  The instruments were then removed from the patient's abdomen and the fascial incision was repaired with 0 Vicryl, and the skin was closed with a 4-0 Vicryl subcuticular stitch. The patient tolerated the procedure well.  Instrument, sponge, and needle counts were correct times two.  The patient was then taken to the recovery room awake and in stable condition.   Morgan Milroy, MD, [place] Attending [place], [facility]

## 2021-04-22 NOTE — Anesthesia Postprocedure Evaluation (Signed)
Anesthesia Post Note  Patient: Morgan Singleton  Procedure(s) Performed: AN AD HOC LABOR EPIDURAL     Patient location during evaluation: Mother Baby Anesthesia Type: Epidural Level of consciousness: awake and alert and oriented Pain management: satisfactory to patient Vital Signs Assessment: post-procedure vital signs reviewed and stable Respiratory status: respiratory function stable Cardiovascular status: stable Postop Assessment: no headache, no backache, epidural receding, patient able to bend at knees, no signs of nausea or vomiting, adequate PO intake and able to ambulate Anesthetic complications: no   No notable events documented.  Last Vitals:  Vitals:   04/22/21 0100 04/22/21 0500  BP: 97/62 116/76  Pulse: 89 87  Resp: 17 16  Temp: 36.7 C 36.8 C  SpO2: 99% 100%    Last Pain:  Vitals:   04/22/21 0500  TempSrc: Oral  PainSc: 3    Pain Goal:                   [name]

## 2021-04-22 NOTE — Progress Notes (Signed)
Ordered pt lunch and snack, by Juliann Mule Spanish Interpreter.

## 2021-04-22 NOTE — Anesthesia Postprocedure Evaluation (Signed)
Anesthesia Post Note  Patient: Morgan Singleton  Procedure(s) Performed: POST PARTUM TUBAL LIGATION (Bilateral)     Patient location during evaluation: PACU Anesthesia Type: Epidural Level of consciousness: awake and alert and oriented Pain management: pain level controlled Vital Signs Assessment: post-procedure vital signs reviewed and stable Respiratory status: spontaneous breathing, nonlabored ventilation and respiratory function stable Cardiovascular status: blood pressure returned to baseline Postop Assessment: epidural receding, no apparent nausea or vomiting, no headache and no backache Anesthetic complications: no   No notable events documented.  Last Vitals:  Vitals:   04/22/21 1200 04/22/21 1215  BP: 98/69 105/73  Pulse: 73 73  Resp: 15 20  Temp:    SpO2: 98% 98%    Last Pain:  Vitals:   04/22/21 1215  TempSrc:   PainSc: 0-No pain   Pain Goal: Patients Stated Pain Goal: 3 (04/22/21 [id number])              Epidural/Spinal Function Cutaneous sensation: Tingles (04/22/21 1145), Patient able to flex knees: Yes (04/22/21 1145), Patient able to lift hips off bed: No (04/22/21 1145), Back pain beyond tenderness at insertion site: No (04/22/21 1145), Progressively worsening motor and/or sensory loss: No (04/22/21 1145), Bowel and/or bladder incontinence post epidural: No (04/22/21 1145)  Marthenia Rolling

## 2021-04-22 NOTE — Transfer of Care (Signed)
Immediate Anesthesia Transfer of Care Note  Patient: Morgan Singleton  Procedure(s) Performed: POST PARTUM TUBAL LIGATION (Bilateral)  Patient Location: PACU  Anesthesia Type:Epidural  Level of Consciousness: awake, alert  and oriented  Airway & Oxygen Therapy: Patient Spontanous Breathing  Post-op Assessment: Report given to RN and Post -op Vital signs reviewed and stable  Post vital signs: Reviewed and stable  Last Vitals:  Vitals Value Taken Time  BP 103/68 04/22/21 1134  Temp 36.4 C 04/22/21 1134  Pulse 72 04/22/21 1136  Resp 19 04/22/21 1136  SpO2 97 % 04/22/21 1136  Vitals shown include unvalidated device data.  Last Pain:  Vitals:   04/22/21 1134  TempSrc: Oral  PainSc:       Patients Stated Pain Goal: 3 ([id number])  Complications: No notable events documented.

## 2021-04-22 NOTE — Anesthesia Preprocedure Evaluation (Addendum)
Anesthesia Evaluation  Patient identified by MRN, date of birth, ID band Patient awake    Reviewed: Allergy & Precautions, NPO status , Patient's Chart, lab work & pertinent test results  History of Anesthesia Complications Negative for: history of anesthetic complications  Airway Mallampati: II  TM Distance: >3 FB Neck ROM: Full    Dental no notable dental hx.    Pulmonary neg pulmonary ROS,    Pulmonary exam normal        Cardiovascular negative cardio ROS Normal cardiovascular exam     Neuro/Psych negative neurological ROS  negative psych ROS   GI/Hepatic negative GI ROS, Neg liver ROS,   Endo/Other  negative endocrine ROS  Renal/GU negative Renal ROS  negative genitourinary   Musculoskeletal negative musculoskeletal ROS (+)   Abdominal   Peds  Hematology negative hematology ROS (+)   Anesthesia Other Findings Day of surgery medications reviewed with patient.  Reproductive/Obstetrics PPD1, undesired fertility                            Anesthesia Physical Anesthesia Plan  ASA: 2  Anesthesia Plan: Epidural   Post-op Pain Management:    Induction:   PONV Risk Score and Plan: 2 and Treatment may vary due to age or medical condition and Ondansetron  Airway Management Planned: Natural Airway  Additional Equipment: None  Intra-op Plan:   Post-operative Plan:   Informed Consent: I have reviewed the patients History and Physical, chart, labs and discussed the procedure including the risks, benefits and alternatives for the proposed anesthesia with the patient or authorized representative who has indicated his/her understanding and acceptance.     Interpreter used for [employer] Discussed with: CRNA  Anesthesia Plan Comments:        Anesthesia Quick Evaluation

## 2021-04-23 MED ORDER — IBUPROFEN 600 MG PO TABS: 600.0000 mg | ORAL_TABLET | Freq: Four times a day (QID) | ORAL | 0 refills | Status: DC

## 2021-04-23 MED ORDER — OXYCODONE-ACETAMINOPHEN 5-325 MG PO TABS: 1.0000 | ORAL_TABLET | Freq: Four times a day (QID) | ORAL | 0 refills | Status: DC | PRN

## 2021-04-24 ENCOUNTER — Inpatient Hospital Stay (HOSPITAL_COMMUNITY)
Admission: AD | Admit: 2021-04-24 | Payer: Medicaid Other | Source: Home / Self Care | Admitting: Obstetrics and Gynecology

## 2021-04-24 ENCOUNTER — Inpatient Hospital Stay (HOSPITAL_COMMUNITY): Payer: Medicaid Other

## 2021-04-26 LAB — SURGICAL PATHOLOGY

## 2021-04-27 ENCOUNTER — Ambulatory Visit: Payer: Medicaid Other

## 2021-05-04 ENCOUNTER — Telehealth (HOSPITAL_COMMUNITY): Payer: Self-pay | Admitting: *Deleted

## 2021-05-04 NOTE — Telephone Encounter (Signed)
Attempted Hospital Discharge Follow-Up Call.  No answer.

## 2021-05-20 ENCOUNTER — Encounter: Payer: Self-pay | Admitting: General Practice

## 2021-05-28 NOTE — Progress Notes (Signed)
8/24: svd c/b mild shoulder dystocia 8/25: PPTL     Post Partum Visit Note  Morgan Singleton is a 45 y.o. [id number] female who presents for a postpartum visit. She is 6 weeks postpartum following a normal spontaneous vaginal delivery.  I have fully reviewed the prenatal and intrapartum course. The delivery was at 22 gestational weeks.  Anesthesia: epidural. Postpartum course has been uncomplicated. Baby is doing well. Baby is feeding by breast.  no bleeding. Bowel function is normal. Bladder function is normal. Patient is not sexually active. Contraception method is tubal ligation. Postpartum depression screening: no.  The pregnancy intention screening data noted above was reviewed. Potential methods of contraception were discussed. The patient elected to proceed with No data recorded.   Edinburgh Postnatal Depression Scale - 05/31/21 0953       Edinburgh Postnatal Depression Scale:  In the Past 7 Days   I have been able to laugh and see the funny side of things. 0    I have looked forward with enjoyment to things. 0    I have blamed myself unnecessarily when things went wrong. 0    I have been anxious or worried for no good reason. 0    I have felt scared or panicky for no good reason. 0    Things have been getting on top of me. 0    I have been so unhappy that I have had difficulty sleeping. 0    I have felt sad or miserable. 0    I have been so unhappy that I have been crying. 0    The thought of harming myself has occurred to me. 0    Edinburgh Postnatal Depression Scale Total 0             Health Maintenance Due  Topic Date Due   Hepatitis C Screening  Never done   PAP SMEAR-Modifier  10/31/2020   INFLUENZA VACCINE  03/29/2021    The following portions of the patient's history were reviewed and updated as appropriate: allergies, current medications, past family history, past medical history, past social history, past surgical history, and problem list.  Review of  Systems Pertinent items are noted in HPI.  Objective:  BP 118/76   Pulse 87   Wt 127 lb 8 oz (57.8 kg)   Breastfeeding Yes   BMI 25.75 kg/m    General:  alert and no distress   Breasts:  not indicated  Lungs: Normal respiratory effort  Heart:  regular rate and rhythm  Abdomen: soft, non-tender; bowel sounds normal; no masses,  no organomegaly   Wound well approximated incision  GU exam:  normal - perineum well healed,        Assessment:    There are no diagnoses linked to this encounter.  6wk postpartum exam.   Plan:   Essential components of care per ACOG recommendations:  1.  Mood and well being: Patient with negative depression screening today. Reviewed local resources for support.  - Patient tobacco use? No.   - hx of drug use? No.    2. Infant care and feeding:  -Patient currently breastmilk feeding? Yes. Reviewed importance of draining breast regularly to support lactation.  -Social determinants of health (SDOH) reviewed in EPIC. No concerns  3. Sexuality, contraception and birth spacing - She had postpartum tubal ligation with removal of the one tube (prior tube removed at time of ectopic pregnancy)  4. Sleep and fatigue -Encouraged family/partner/community support of 4 hrs of uninterrupted sleep  to help with mood and fatigue  5. Physical Recovery  - Discussed patients delivery and complications. She describes her labor as good. - Patient had a Vaginal problems after delivery including mild shoulder dystocia . Patient had a 1st degree laceration. Perineal healing reviewed. Patient expressed understanding - Patient has urinary incontinence? No. - Patient is safe to resume physical and sexual activity  6.  Health Maintenance - HM due items addressed Yes - Last pap smear  Diagnosis  Date Value Ref Range Status  10/31/2017   Final   NEGATIVE FOR INTRAEPITHELIAL LESIONS OR MALIGNANCY.   Pap smear done at today's visit.  -Breast Cancer screening indicated?  Yes. Patient referred today for mammogram. This would be completed once down with nursing. Discussed can be done while nursing, but most radiologists prefer once nursing completed unless of course breast lump was felt.  She will call when she has completed nursing. If she no longer has her insurance at that time, we will refer through [place]  7. Chronic Disease/Pregnancy Condition follow up: None  - PCP follow up  Radene Gunning, [place] for [facility], [place]

## 2021-05-31 ENCOUNTER — Ambulatory Visit (INDEPENDENT_AMBULATORY_CARE_PROVIDER_SITE_OTHER): Payer: Medicaid Other | Admitting: Obstetrics and Gynecology

## 2021-05-31 ENCOUNTER — Other Ambulatory Visit (HOSPITAL_COMMUNITY)
Admission: RE | Admit: 2021-05-31 | Discharge: 2021-05-31 | Disposition: A | Discharge status: Home / Self Care | Payer: Medicaid Other | Source: Ambulatory Visit | Attending: Obstetrics and Gynecology | Admitting: Obstetrics and Gynecology

## 2021-05-31 ENCOUNTER — Other Ambulatory Visit: Payer: Self-pay

## 2021-05-31 ENCOUNTER — Encounter: Payer: Self-pay | Admitting: Obstetrics and Gynecology

## 2021-05-31 ENCOUNTER — Ambulatory Visit: Payer: Medicaid Other | Admitting: Obstetrics and Gynecology

## 2021-05-31 NOTE — Progress Notes (Signed)
See my original note.

## 2021-06-02 LAB — CYTOLOGY - PAP
Comment: NEGATIVE
Diagnosis: NEGATIVE
High risk HPV: NEGATIVE

## 2022-12-05 ENCOUNTER — Ambulatory Visit (INDEPENDENT_AMBULATORY_CARE_PROVIDER_SITE_OTHER): Payer: Medicaid Other | Admitting: Obstetrics and Gynecology

## 2022-12-05 ENCOUNTER — Encounter: Payer: Self-pay | Admitting: Obstetrics and Gynecology

## 2022-12-05 ENCOUNTER — Other Ambulatory Visit: Payer: Self-pay

## 2022-12-05 VITALS — BP 123/81 | HR 73 | Ht 59.0 in | Wt 114.0 lb

## 2022-12-05 DIAGNOSIS — N898 Other specified noninflammatory disorders of vagina: Secondary | ICD-10-CM

## 2022-12-05 DIAGNOSIS — Z1239 Encounter for other screening for malignant neoplasm of breast: Secondary | ICD-10-CM | POA: Diagnosis not present

## 2022-12-05 NOTE — Progress Notes (Signed)
Pt reports bumps on Vaginal for about 2 yrs now, they initially were small but now they're bigger.No irritation/nor pain.

## 2022-12-05 NOTE — Progress Notes (Signed)
Ms Morgan Singleton presents with c/o vaginal bump for the last 2 yrs. No pain with intercourse. Cycles monthly H/O BTL Pap smear UTP Sexual active, same partner  PE AF VS Chaperone present  Lungs clear Heart RRR Abd soft + BS GU nl EGBUS, small, > 0/5 cm vaginal cyst, otherwise vaginal mucosa normal  Offered I & D of cyst. Pt agreed Hurricane spray use and cyst open with 18 gauge needle. Small amount of clear, mucoid fluid expressed Pt tolerated well  A/P Vaginal cyst, s/p I & D  F/U PRN Video interrupter used during today's visit

## 2022-12-20 ENCOUNTER — Ambulatory Visit
Admission: RE | Admit: 2022-12-20 | Discharge: 2022-12-20 | Disposition: A | Discharge status: Home / Self Care | Payer: Medicaid Other | Source: Ambulatory Visit | Attending: Obstetrics and Gynecology | Admitting: Obstetrics and Gynecology

## 2022-12-20 DIAGNOSIS — Z1239 Encounter for other screening for malignant neoplasm of breast: Secondary | ICD-10-CM

## 2022-12-20 DIAGNOSIS — Z1231 Encounter for screening mammogram for malignant neoplasm of breast: Secondary | ICD-10-CM | POA: Diagnosis not present

## 2023-04-23 IMAGING — US US MFM OB FOLLOW-UP
1 series · 13 of 28 positions shown · non-contrast
Comparison: none

[Series 1: us mfm ob follow-up · 30 acquisitions, 13 frames shown]
[im 2/30]
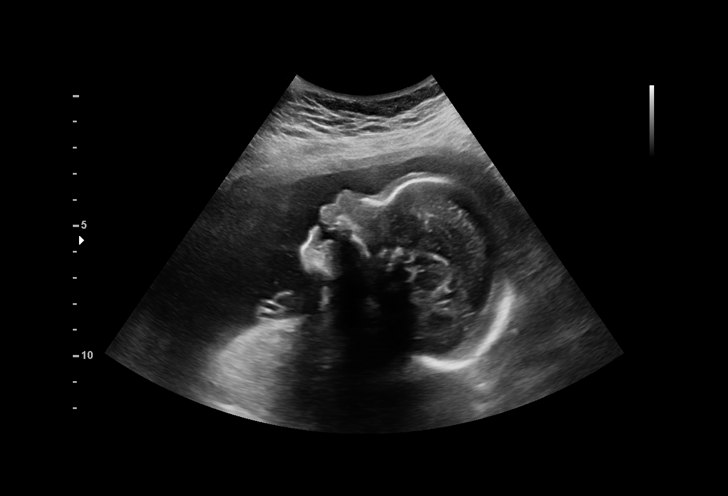
[im 4/30]
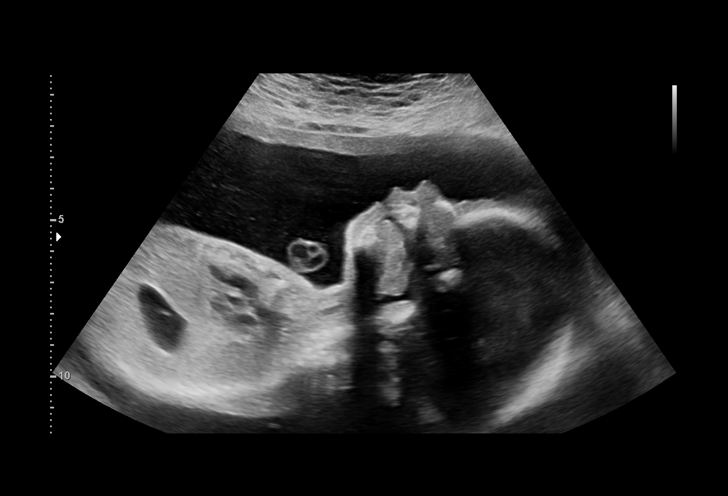
[im 6/30]
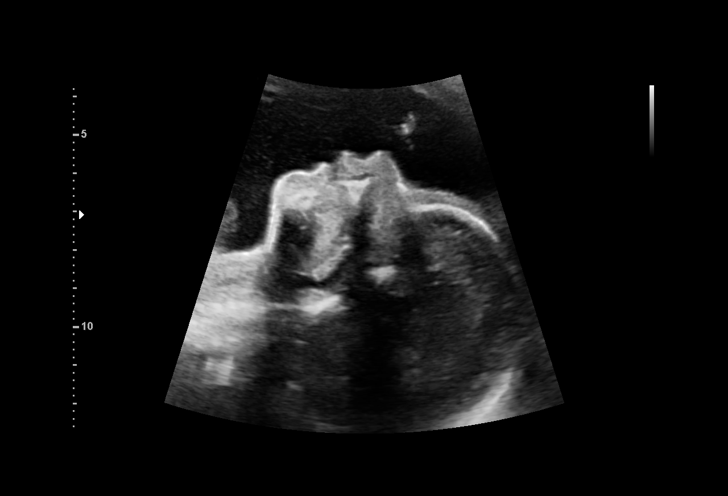
[im 8/30]
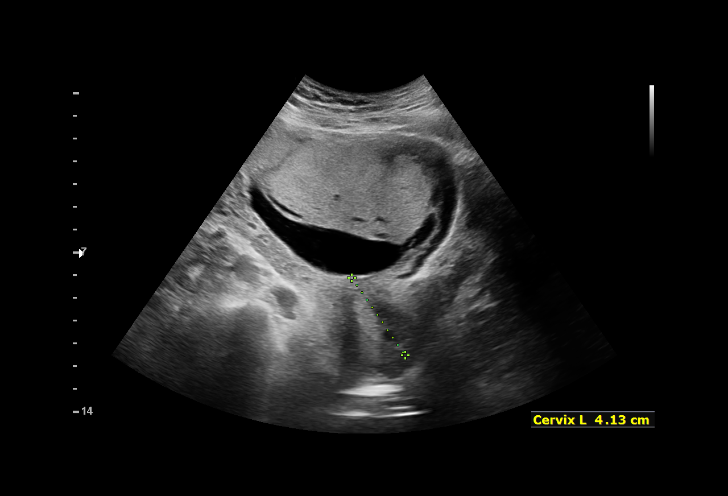
[im 10/30]
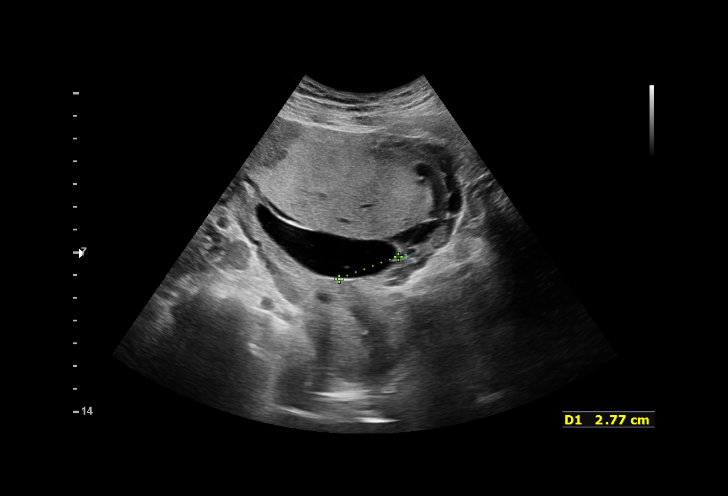
[im 12/30]
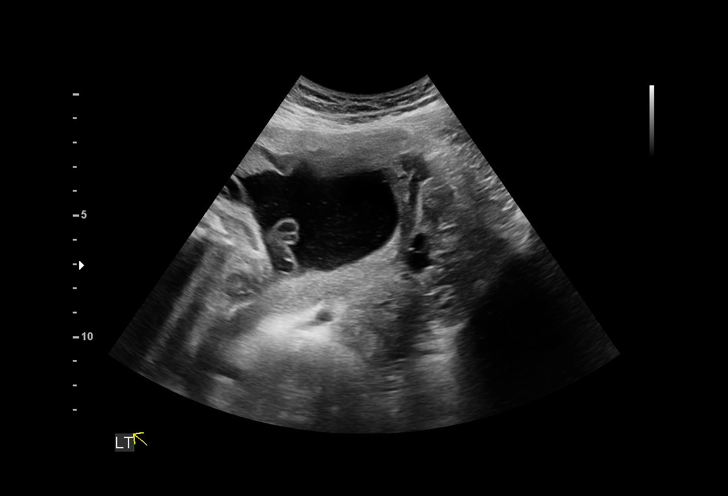
[im 16/30]
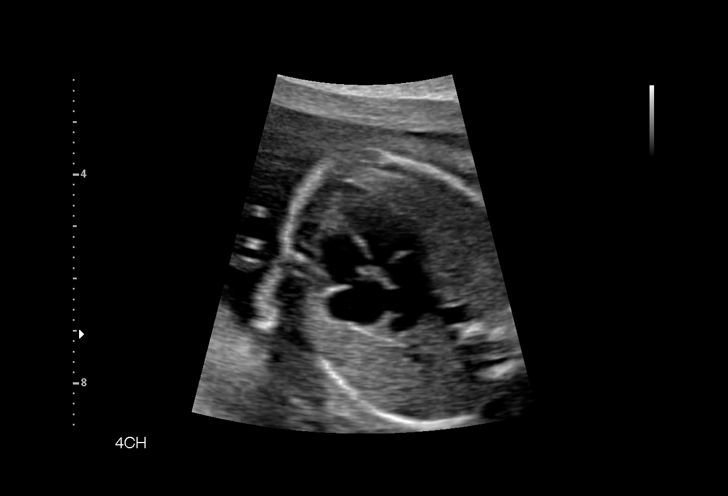
[im 18/30]
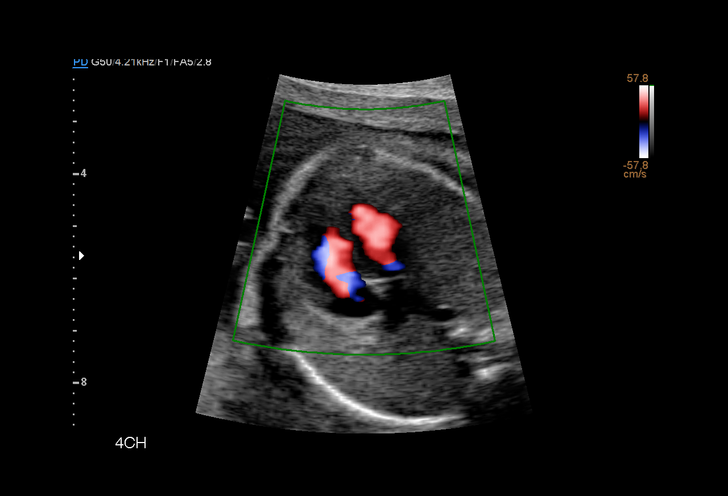
[im 20/30]
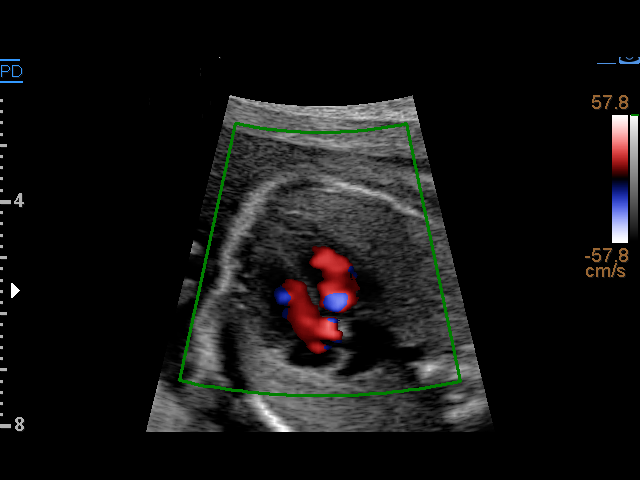
[im 22/30]
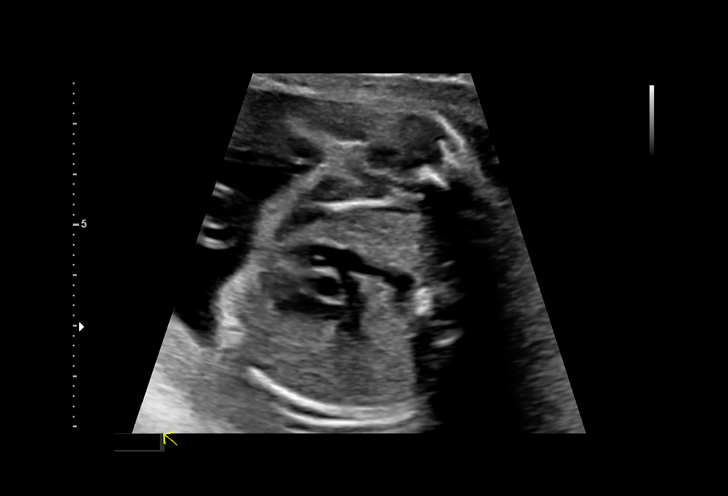
[im 24/30]
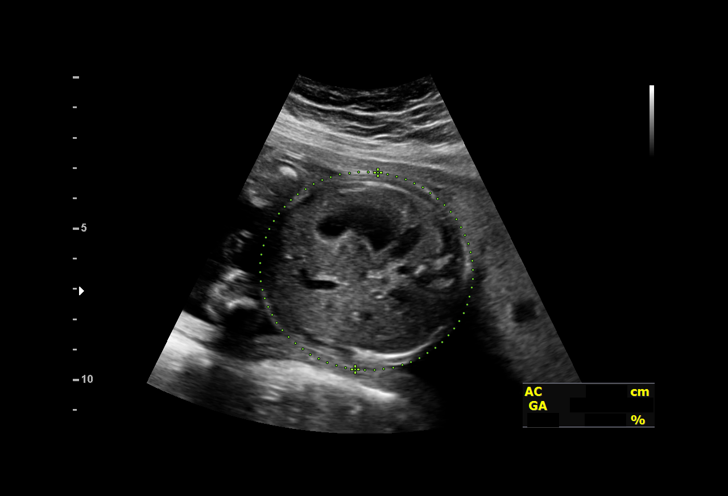
[im 26/30]
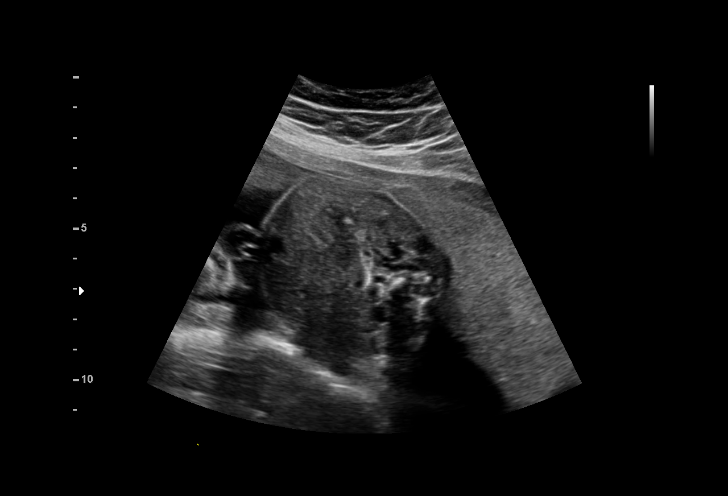
[im 28/30]
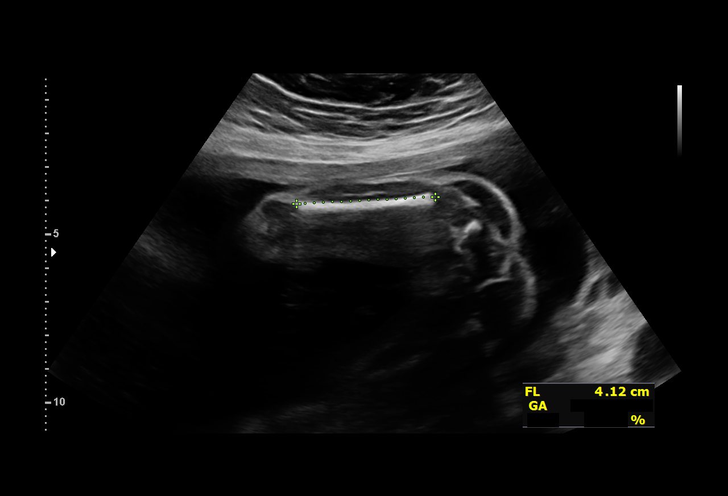

[13 of 28 positions shown; findings below may reference images not displayed]

[facility]ORDY

                                                            [facility] [HOSPITAL]
                   MAGDA CNM                             [HOSPITAL] at

Indications

 23 weeks gestation of pregnancy
 Vaginal bleeding in pregnancy, second
 trimester (Spotting)
 Advanced maternal age multigravida 35+,
 second trimester (44)
 Poor obstetric history: Previous midtrimester
 loss (19 wk IUFD)
 Antenatal screening for malformations
 Genetic carrier (silent Kokiso Helmet)
 Low risk NIPS
Fetal Evaluation

 Num Of Fetuses:         1
 Fetal Heart Rate(bpm):  123
 Cardiac Activity:       Observed
 Presentation:           Transverse, head to maternal left
 Placenta:               Anterior
 P. Cord Insertion:      Previously Visualized

 Amniotic Fluid
 AFI FV:      Within normal limits
Biometry

 BPD:      57.2  mm     G. Age:  23w 4d         36  %    CI:        72.19   %    70 - 86
                                                         FL/HC:      19.2   %    18.7 -
 HC:      214.2  mm     G. Age:  23w 3d         24  %    HC/AC:      0.98        1.05 -
 AC:      217.5  mm     G. Age:  26w 2d         97  %    FL/BPD:     71.9   %    71 - 87
 FL:       41.1  mm     G. Age:  23w 2d         26  %    FL/AC:      18.9   %    20 - 24
 LV:        5.6  mm

 Est. FW:     733  gm    1 lb 10 oz      88  %
OB History

 Gravidity:    7         Term:   3        Prem:   0        SAB:   2
 TOP:          0       Ectopic:  0        Living: 3
Gestational Age

 LMP:           25w 1d        Date:  07/15/20                 EDD:   04/21/21
 U/S Today:     24w 1d                                        EDD:   04/28/21
 Best:          23w 5d     Det. By:  Early Ultrasound         EDD:   05/01/21
                                     (09/22/20)
Anatomy

 Cranium:               Previously seen        Aortic Arch:            Previously seen
 Cavum:                 Previously seen        Ductal Arch:            Previously seen
 Ventricles:            Appears normal         Diaphragm:              Appears normal
 Choroid Plexus:        Previously seen        Stomach:                Appears normal, left
                                                                       sided
 Cerebellum:            Previously seen        Abdomen:                Previously seen
 Posterior Fossa:       Previously seen        Abdominal Wall:         Previously seen
 Nuchal Fold:           Previously seen        Cord Vessels:           Previously seen
 Face:                  Appears normal         Kidneys:                Appear normal
                        (orbits and profile)
 Lips:                  Previously seen        Bladder:                Appears normal
 Thoracic:              Previously seen        Spine:                  Previously seen
 Heart:                 Appears normal         Upper Extremities:      Previously seen
                        (4CH, axis, and
                        situs)
 RVOT:                  Appears normal         Lower Extremities:      Previously seen
 LVOT:                  Previously seen

 Other:  Fetus appears to be female. SVC/IVC and 3VV previously visualized.
Cervix Uterus Adnexa

 Cervix
 Length:           4.13  cm.
 Normal appearance by transabdominal scan.

 Right Ovary
 Within normal limits.

 Left Ovary
 Within normal limits.
Impression

 Patient returned for completion of fetal anatomy .Fetal growth
 is appropriate for gestational age .Amniotic fluid is normal
 and good fetal activity is seen .Fetal anatomical survey was
 completed and appears normal.
 On previous ultrasound, it was reported that low-lying
 placenta had resolved. We could not assess the placenta
 well on transabdominal scan. Patient still reports mild vaginal
 bleeding (spotting).
Recommendations

 -An appointment was made for her to return in 4 weeks for
 fetal growth assessment.
 -Transvaginal ultrasound evaluation if patient still has vaginal
 bleeding and/or if the placenta cannot be assessed on
 transabdominal scan.
                 [place], Nonofo Nonie Nonoza

## 2023-06-22 IMAGING — US US MFM FETAL BPP W/O NON-STRESS
1 series · 13 of 25 positions shown · non-contrast
Comparison: none

[Series 1: us mfm fetal bpp w/o non-stress · 25 acquisitions, 13 frames shown]
[im 1/25]
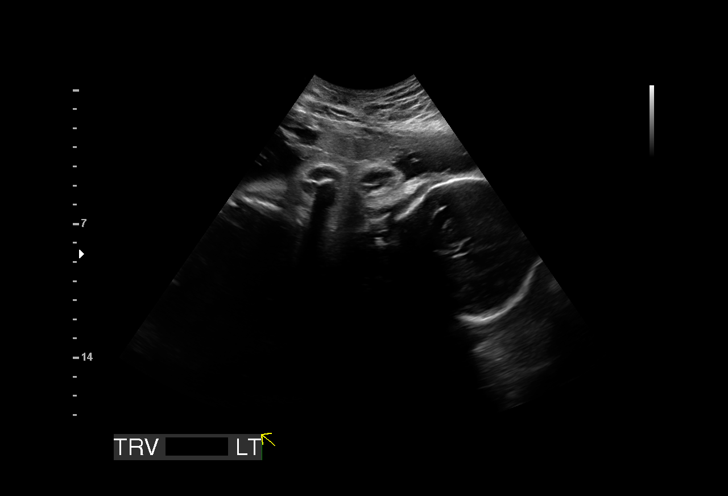
[im 3/25]
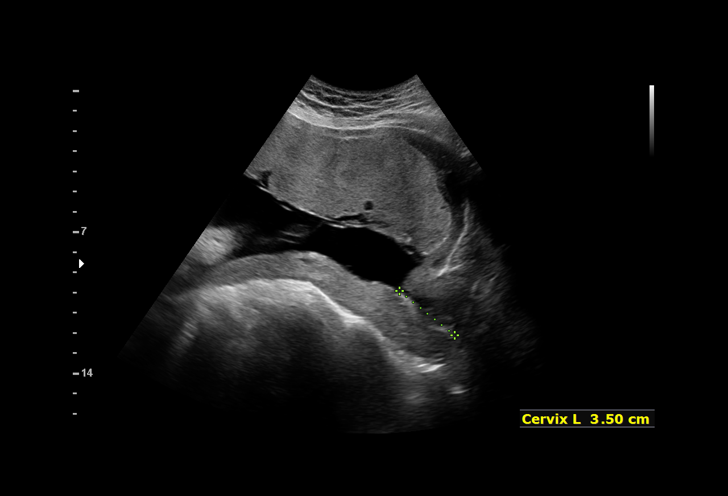
[im 5/25]
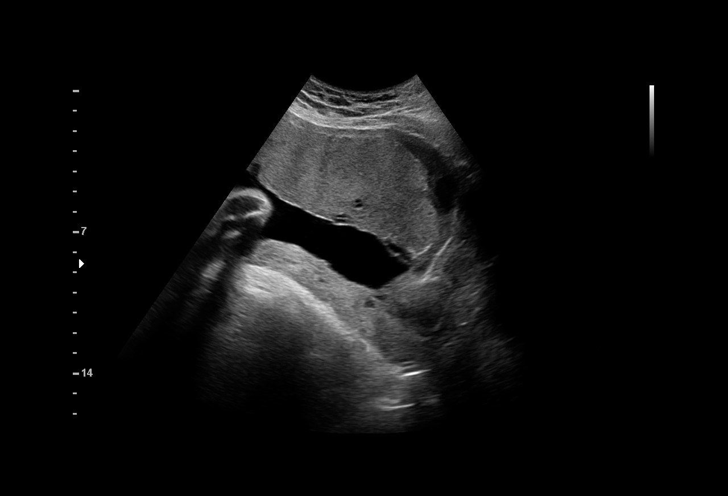
[im 7/25]
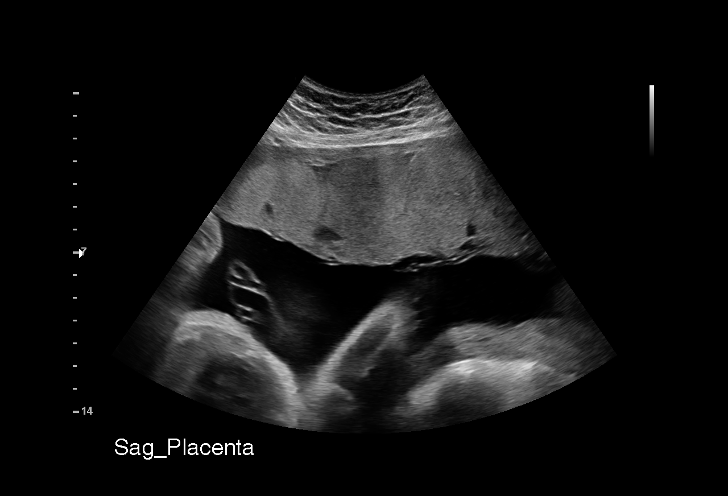
[im 9/25]
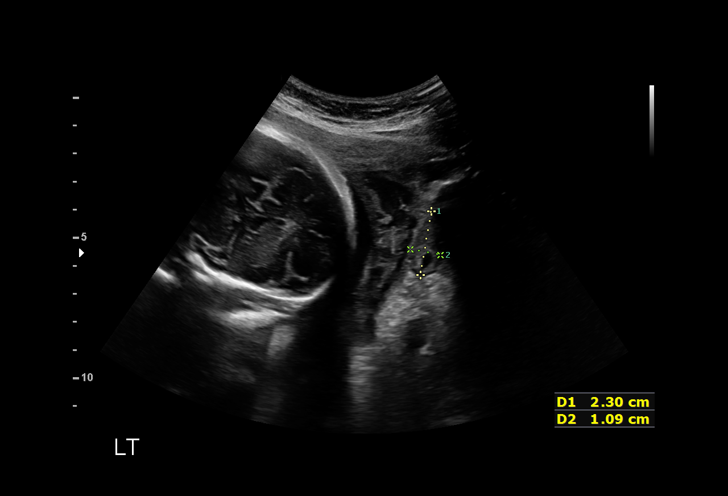
[im 11/25]
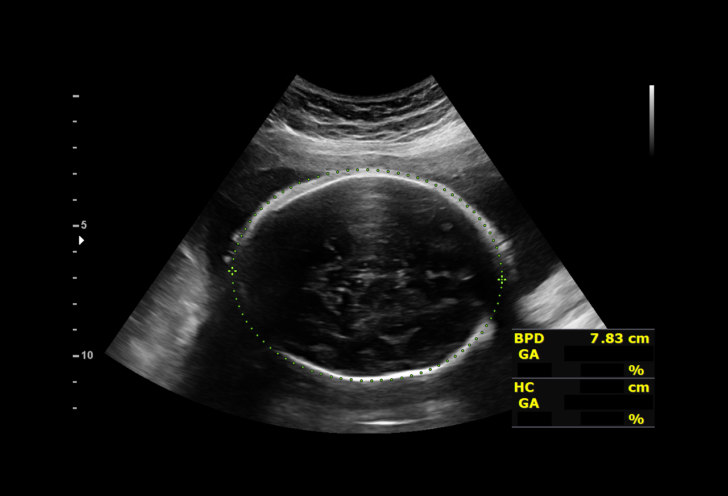
[im 13/25]
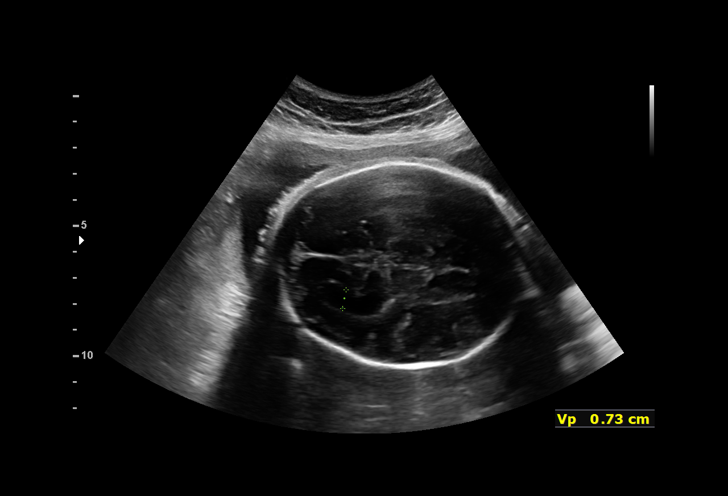
[im 15/25]
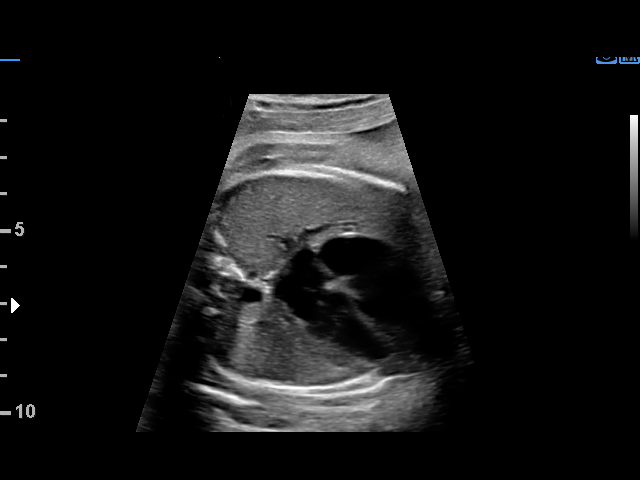
[im 17/25]
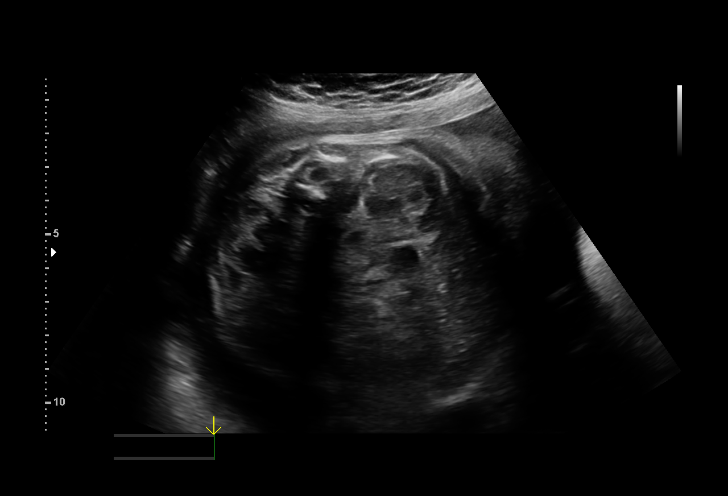
[im 19/25]
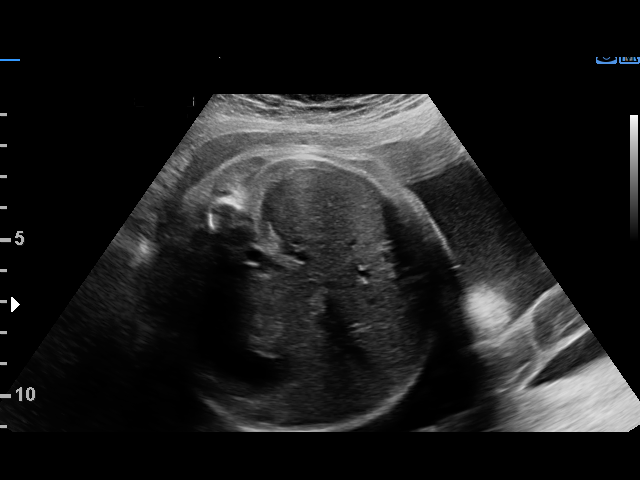
[im 21/25]
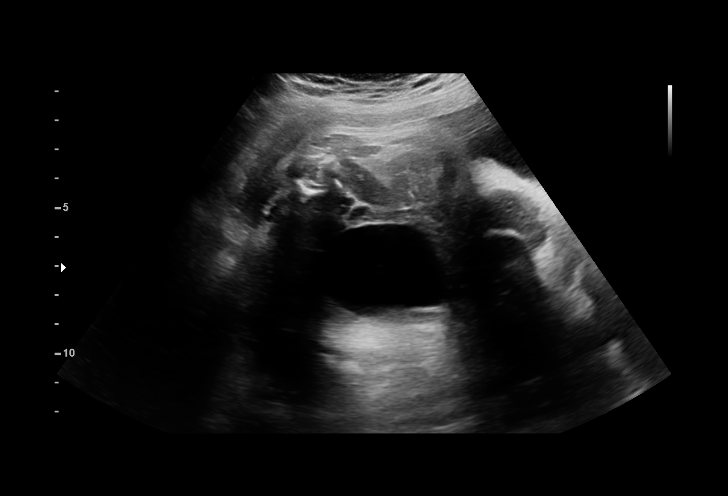
[im 23/25]
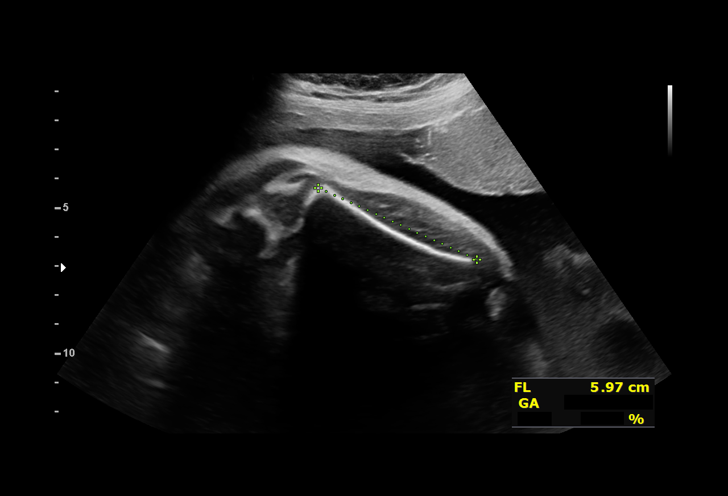
[im 25/25]
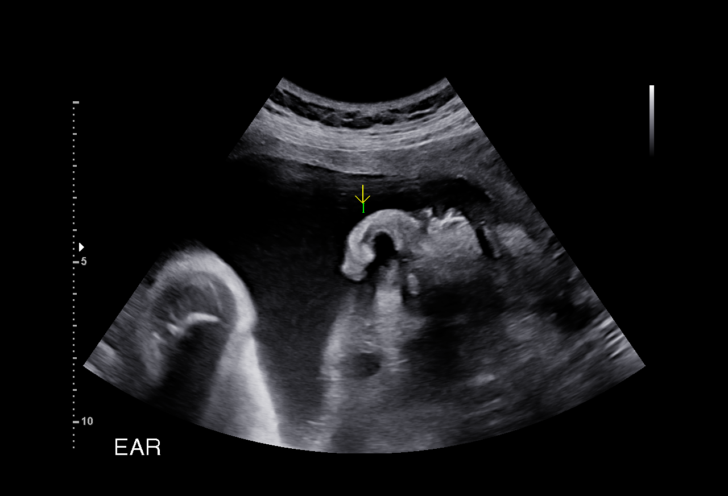

[13 of 25 positions shown; findings below may reference images not displayed]

[facility]

                                                            [facility] [[facility]]
                   HOLDEN CNM                             [HOSPITAL] at

Indications

 32 weeks gestation of pregnancy
 Advanced maternal age multigravida 35+,
 third trimester (44)
 Poor obstetric history: Previous midtrimester
 loss (19 wk IUFD)
 Genetic carrier (silent Prabh Hendrick)
 Low risk NIPS
 Encounter for other antenatal screening
 follow-up
Fetal Evaluation

 Num Of Fetuses:         1
 Fetal Heart Rate(bpm):  157
 Cardiac Activity:       Observed
 Presentation:           Transverse, head to maternal left
 Placenta:               Anterior
 P. Cord Insertion:      Previously Visualized

 Amniotic Fluid
 AFI FV:      Within normal limits

 AFI Sum(cm)     %Tile       Largest Pocket(cm)
 19.98           75

 RUQ(cm)       RLQ(cm)       LUQ(cm)        LLQ(cm)

Biophysical Evaluation

 Amniotic F.V:   Within normal limits       F. Tone:        Observed
 F. Movement:    Observed                   Score:          [DATE]
 F. Breathing:   Observed
Biometry

 BPD:      78.1  mm     G. Age:  31w 2d         16  %    CI:         71.5   %    70 - 86
                                                         FL/HC:      20.4   %    19.1 -
 HC:      294.1  mm     G. Age:  32w 3d         18  %    HC/AC:      0.97        0.96 -
 AC:      301.7  mm     G. Age:  34w 1d         92  %    FL/BPD:     77.0   %    71 - 87
 FL:       60.1  mm     G. Age:  31w 2d         14  %    FL/AC:      19.9   %    20 - 24

 LV:        7.3  mm

 Est. FW:    9685  gm      4 lb 9 oz     59  %
OB History

 Gravidity:    7         Term:   3        Prem:   0        SAB:   2
 TOP:          0       Ectopic:  0        Living: 3
Gestational Age

 LMP:           33w 5d        Date:  07/15/20                 EDD:   04/21/21
 U/S Today:     32w 2d                                        EDD:   05/01/21
 Best:          32w 2d     Det. By:  Early Ultrasound         EDD:   05/01/21
                                     (09/22/20)
Anatomy

 Cranium:               Appears normal         Aortic Arch:            Previously seen
 Cavum:                 Previously seen        Ductal Arch:            Previously seen
 Ventricles:            Appears normal         Diaphragm:              Previously seen
 Choroid Plexus:        Previously seen        Stomach:                Appears normal, left
                                                                       sided
 Cerebellum:            Previously seen        Abdomen:                Previously seen
 Posterior Fossa:       Previously seen        Abdominal Wall:         Previously seen
 Nuchal Fold:           Previously seen        Cord Vessels:           Previously seen
 Face:                  Orbits and profile     Kidneys:                Appear normal
                        previously seen
 Lips:                  Previously seen        Bladder:                Appears normal
 Thoracic:              Previously seen        Spine:                  Previously seen
 Heart:                 Appears normal         Upper Extremities:      Previously seen
                        (4CH, axis, and
                        situs)
 RVOT:                  Previously seen        Lower Extremities:      Previously seen
 LVOT:                  Previously seen

 Other:  Female gender previously seen. Heels and 5th digit previously seen.
         VC, 3VV and 3VTV previously visualized.
Cervix Uterus Adnexa

 Cervix
 Length:            3.5  cm.
 Not visualized (advanced GA >96wks)
 Right Ovary
 Visualized.

 Left Ovary
 Visualized.
Impression

 Patient returned for fetal growth assessment plan.  She had 3
 term vaginal deliveries followed by a 19-week pregnancy loss.
 Patient does not have gestational diabetes.  Blood pressure
 today at her office is 114/71 mmHg.

 Amniotic fluid is normal and good fetal activity is seen .Fetal
 growth is appropriate for gestational age .Antenatal testing is
 reassuring. BPP [DATE].

 We reassured the patient of the findings.
Recommendations

 -An appointment was made for her to return in 4 weeks for
 fetal growth assessment.
 -Weekly BPP from 36 weeks' gestation till delivery.
                 Ruis, Deep

## 2023-12-01 ENCOUNTER — Telehealth: Payer: Self-pay

## 2023-12-01 NOTE — Telephone Encounter (Addendum)
 Telephoned patient using language line interpreter. Person answered and released the call. [employer] (scholarship, mobile event)  Telephoned patient using interpreter, Morgan Singleton. Left voice message with [employer] contact information.

## 2023-12-05 ENCOUNTER — Telehealth: Payer: Self-pay

## 2023-12-05 NOTE — Telephone Encounter (Signed)
 Telephoned patient at mobile number using interpreter#[phone]. Left [employer] contact information. Patient has [place] [employer].

## 2024-02-22 ENCOUNTER — Ambulatory Visit

## 2024-03-11 DIAGNOSIS — M67431 Ganglion, right wrist: Secondary | ICD-10-CM | POA: Diagnosis not present

## 2024-03-11 DIAGNOSIS — Z7689 Persons encountering health services in other specified circumstances: Secondary | ICD-10-CM | POA: Diagnosis not present

## 2024-03-11 DIAGNOSIS — Z0100 Encounter for examination of eyes and vision without abnormal findings: Secondary | ICD-10-CM | POA: Diagnosis not present

## 2024-03-28 ENCOUNTER — Other Ambulatory Visit: Payer: Self-pay | Admitting: Nurse Practitioner

## 2024-03-28 DIAGNOSIS — Z1231 Encounter for screening mammogram for malignant neoplasm of breast: Secondary | ICD-10-CM

## 2024-04-09 ENCOUNTER — Ambulatory Visit
Admission: RE | Admit: 2024-04-09 | Discharge: 2024-04-09 | Disposition: A | Discharge status: Home / Self Care | Source: Ambulatory Visit | Attending: Nurse Practitioner | Admitting: Nurse Practitioner

## 2024-04-09 DIAGNOSIS — Z1231 Encounter for screening mammogram for malignant neoplasm of breast: Secondary | ICD-10-CM

## 2024-04-10 DIAGNOSIS — Z114 Encounter for screening for human immunodeficiency virus [HIV]: Secondary | ICD-10-CM | POA: Diagnosis not present

## 2024-04-10 DIAGNOSIS — Z131 Encounter for screening for diabetes mellitus: Secondary | ICD-10-CM | POA: Diagnosis not present

## 2024-04-10 DIAGNOSIS — Z1159 Encounter for screening for other viral diseases: Secondary | ICD-10-CM | POA: Diagnosis not present

## 2024-04-10 DIAGNOSIS — Z1389 Encounter for screening for other disorder: Secondary | ICD-10-CM | POA: Diagnosis not present

## 2024-04-10 DIAGNOSIS — Z1211 Encounter for screening for malignant neoplasm of colon: Secondary | ICD-10-CM | POA: Diagnosis not present

## 2024-04-10 DIAGNOSIS — Z113 Encounter for screening for infections with a predominantly sexual mode of transmission: Secondary | ICD-10-CM | POA: Diagnosis not present

## 2024-04-10 DIAGNOSIS — Z1329 Encounter for screening for other suspected endocrine disorder: Secondary | ICD-10-CM | POA: Diagnosis not present

## 2024-04-10 DIAGNOSIS — Z0000 Encounter for general adult medical examination without abnormal findings: Secondary | ICD-10-CM | POA: Diagnosis not present

## 2024-04-10 DIAGNOSIS — Z13220 Encounter for screening for lipoid disorders: Secondary | ICD-10-CM | POA: Diagnosis not present
